# Patient Record
Sex: Female | Born: 1955
Health system: Southern US, Community
[De-identification: ages and names within clinical notes are randomized; demographics above are authoritative.]

## PROBLEM LIST (undated history)

## (undated) DIAGNOSIS — I4891 Unspecified atrial fibrillation: Secondary | ICD-10-CM

## (undated) DIAGNOSIS — E079 Disorder of thyroid, unspecified: Secondary | ICD-10-CM

## (undated) DIAGNOSIS — T7840XA Allergy, unspecified, initial encounter: Secondary | ICD-10-CM

## (undated) DIAGNOSIS — H269 Unspecified cataract: Secondary | ICD-10-CM

## (undated) DIAGNOSIS — E0590 Thyrotoxicosis, unspecified without thyrotoxic crisis or storm: Secondary | ICD-10-CM

## (undated) DIAGNOSIS — T41205A Adverse effect of unspecified general anesthetics, initial encounter: Secondary | ICD-10-CM

## (undated) DIAGNOSIS — F32A Depression, unspecified: Secondary | ICD-10-CM

## (undated) DIAGNOSIS — Z9889 Other specified postprocedural states: Secondary | ICD-10-CM

## (undated) DIAGNOSIS — R112 Nausea with vomiting, unspecified: Secondary | ICD-10-CM

## (undated) DIAGNOSIS — D649 Anemia, unspecified: Secondary | ICD-10-CM

## (undated) DIAGNOSIS — M797 Fibromyalgia: Secondary | ICD-10-CM

## (undated) DIAGNOSIS — G43909 Migraine, unspecified, not intractable, without status migrainosus: Secondary | ICD-10-CM

## (undated) DIAGNOSIS — J45909 Unspecified asthma, uncomplicated: Secondary | ICD-10-CM

## (undated) HISTORY — PX: BREAST SURGERY: SHX581

## (undated) HISTORY — DX: Unspecified cataract: H26.9

## (undated) HISTORY — PX: APPENDECTOMY: SHX54

## (undated) HISTORY — PX: BUNIONECTOMY: SHX129

## (undated) HISTORY — PX: COLONOSCOPY: SHX174

## (undated) HISTORY — DX: Migraine, unspecified, not intractable, without status migrainosus: G43.909

## (undated) HISTORY — PX: TUBAL LIGATION: SHX77

## (undated) HISTORY — PX: ENDOMETRIAL ABLATION: SHX621

## (undated) HISTORY — DX: Disorder of thyroid, unspecified: E07.9

## (undated) HISTORY — DX: Depression, unspecified: F32.A

## (undated) HISTORY — PX: BREAST IMPLANT REMOVAL: SHX5361

## (undated) HISTORY — DX: Allergy, unspecified, initial encounter: T78.40XA

## (undated) HISTORY — PX: KNEE ARTHROSCOPY: SUR90

## (undated) HISTORY — PX: ABDOMINAL HYSTERECTOMY: SHX81

## (undated) HISTORY — PX: CHOLECYSTECTOMY: SHX55

## (undated) HISTORY — PX: EYE SURGERY: SHX253

## (undated) HISTORY — PX: BREAST ENHANCEMENT SURGERY: SHX7

## (undated) HISTORY — DX: Unspecified atrial fibrillation: I48.91

---

## 1997-11-21 ENCOUNTER — Ambulatory Visit (HOSPITAL_COMMUNITY): Admission: RE | Admit: 1997-11-21 | Discharge: 1997-11-21 | Payer: Self-pay | Admitting: Obstetrics and Gynecology

## 1997-12-16 ENCOUNTER — Other Ambulatory Visit: Admission: RE | Admit: 1997-12-16 | Discharge: 1997-12-16 | Payer: Self-pay | Admitting: Obstetrics and Gynecology

## 1999-03-30 ENCOUNTER — Other Ambulatory Visit: Admission: RE | Admit: 1999-03-30 | Discharge: 1999-03-30 | Payer: Self-pay | Admitting: Obstetrics and Gynecology

## 1999-12-19 ENCOUNTER — Encounter: Payer: Self-pay | Admitting: Obstetrics and Gynecology

## 1999-12-19 ENCOUNTER — Encounter: Admission: RE | Admit: 1999-12-19 | Discharge: 1999-12-19 | Payer: Self-pay | Admitting: Obstetrics and Gynecology

## 2000-04-24 ENCOUNTER — Other Ambulatory Visit: Admission: RE | Admit: 2000-04-24 | Discharge: 2000-04-24 | Payer: Self-pay | Admitting: Obstetrics and Gynecology

## 2000-06-19 ENCOUNTER — Other Ambulatory Visit: Admission: RE | Admit: 2000-06-19 | Discharge: 2000-06-19 | Payer: Self-pay | Admitting: Obstetrics and Gynecology

## 2000-06-19 ENCOUNTER — Encounter (INDEPENDENT_AMBULATORY_CARE_PROVIDER_SITE_OTHER): Payer: Self-pay | Admitting: Specialist

## 2000-12-26 ENCOUNTER — Encounter: Payer: Self-pay | Admitting: Obstetrics and Gynecology

## 2000-12-26 ENCOUNTER — Encounter: Admission: RE | Admit: 2000-12-26 | Discharge: 2000-12-26 | Payer: Self-pay | Admitting: Obstetrics and Gynecology

## 2001-06-02 ENCOUNTER — Other Ambulatory Visit: Admission: RE | Admit: 2001-06-02 | Discharge: 2001-06-02 | Payer: Self-pay | Admitting: Obstetrics and Gynecology

## 2002-01-19 ENCOUNTER — Encounter: Payer: Self-pay | Admitting: Obstetrics and Gynecology

## 2002-01-19 ENCOUNTER — Encounter: Admission: RE | Admit: 2002-01-19 | Discharge: 2002-01-19 | Payer: Self-pay | Admitting: Obstetrics and Gynecology

## 2002-07-26 ENCOUNTER — Other Ambulatory Visit: Admission: RE | Admit: 2002-07-26 | Discharge: 2002-07-26 | Payer: Self-pay | Admitting: Obstetrics and Gynecology

## 2003-02-28 ENCOUNTER — Encounter: Admission: RE | Admit: 2003-02-28 | Discharge: 2003-02-28 | Payer: Self-pay | Admitting: Obstetrics and Gynecology

## 2003-02-28 ENCOUNTER — Encounter: Payer: Self-pay | Admitting: Obstetrics and Gynecology

## 2003-08-16 ENCOUNTER — Other Ambulatory Visit: Admission: RE | Admit: 2003-08-16 | Discharge: 2003-08-16 | Payer: Self-pay | Admitting: Obstetrics and Gynecology

## 2004-04-24 ENCOUNTER — Encounter: Admission: RE | Admit: 2004-04-24 | Discharge: 2004-04-24 | Payer: Self-pay | Admitting: Obstetrics and Gynecology

## 2005-05-15 ENCOUNTER — Encounter: Admission: RE | Admit: 2005-05-15 | Discharge: 2005-05-15 | Payer: Self-pay | Admitting: Obstetrics and Gynecology

## 2006-05-19 ENCOUNTER — Encounter: Admission: RE | Admit: 2006-05-19 | Discharge: 2006-05-19 | Payer: Self-pay | Admitting: Obstetrics and Gynecology

## 2007-05-25 ENCOUNTER — Encounter: Admission: RE | Admit: 2007-05-25 | Discharge: 2007-05-25 | Payer: Self-pay | Admitting: Obstetrics and Gynecology

## 2008-06-15 ENCOUNTER — Encounter: Admission: RE | Admit: 2008-06-15 | Discharge: 2008-06-15 | Payer: Self-pay | Admitting: Obstetrics and Gynecology

## 2009-06-21 ENCOUNTER — Encounter: Admission: RE | Admit: 2009-06-21 | Discharge: 2009-06-21 | Payer: Self-pay | Admitting: Obstetrics and Gynecology

## 2010-06-25 ENCOUNTER — Encounter: Admission: RE | Admit: 2010-06-25 | Discharge: 2010-06-25 | Payer: Self-pay | Admitting: Obstetrics and Gynecology

## 2011-06-13 ENCOUNTER — Other Ambulatory Visit: Payer: Self-pay | Admitting: Obstetrics and Gynecology

## 2011-06-13 DIAGNOSIS — Z1231 Encounter for screening mammogram for malignant neoplasm of breast: Secondary | ICD-10-CM

## 2011-07-25 ENCOUNTER — Ambulatory Visit
Admission: RE | Admit: 2011-07-25 | Discharge: 2011-07-25 | Disposition: A | Discharge status: Home / Self Care | Payer: 59 | Source: Ambulatory Visit | Attending: Obstetrics and Gynecology | Admitting: Obstetrics and Gynecology

## 2011-07-25 DIAGNOSIS — Z1231 Encounter for screening mammogram for malignant neoplasm of breast: Secondary | ICD-10-CM

## 2012-06-11 ENCOUNTER — Encounter: Payer: Self-pay | Admitting: Internal Medicine

## 2012-06-11 ENCOUNTER — Emergency Department (HOSPITAL_BASED_OUTPATIENT_CLINIC_OR_DEPARTMENT_OTHER): Payer: 59

## 2012-06-11 ENCOUNTER — Emergency Department (HOSPITAL_BASED_OUTPATIENT_CLINIC_OR_DEPARTMENT_OTHER)
Admission: EM | Admit: 2012-06-11 | Discharge: 2012-06-11 | Disposition: A | Discharge status: Home / Self Care | Payer: 59 | Attending: Emergency Medicine | Admitting: Emergency Medicine

## 2012-06-11 ENCOUNTER — Ambulatory Visit: Payer: 59 | Admitting: Internal Medicine

## 2012-06-11 ENCOUNTER — Ambulatory Visit (INDEPENDENT_AMBULATORY_CARE_PROVIDER_SITE_OTHER): Payer: 59 | Admitting: Internal Medicine

## 2012-06-11 ENCOUNTER — Encounter (HOSPITAL_BASED_OUTPATIENT_CLINIC_OR_DEPARTMENT_OTHER): Payer: Self-pay | Admitting: Emergency Medicine

## 2012-06-11 VITALS — BP 110/80 | HR 105 | Temp 98.0°F | Resp 18 | Ht 67.0 in | Wt 137.0 lb

## 2012-06-11 DIAGNOSIS — I480 Paroxysmal atrial fibrillation: Secondary | ICD-10-CM

## 2012-06-11 DIAGNOSIS — F329 Major depressive disorder, single episode, unspecified: Secondary | ICD-10-CM | POA: Insufficient documentation

## 2012-06-11 DIAGNOSIS — Z139 Encounter for screening, unspecified: Secondary | ICD-10-CM

## 2012-06-11 DIAGNOSIS — I4891 Unspecified atrial fibrillation: Secondary | ICD-10-CM | POA: Insufficient documentation

## 2012-06-11 DIAGNOSIS — R002 Palpitations: Secondary | ICD-10-CM

## 2012-06-11 DIAGNOSIS — IMO0001 Reserved for inherently not codable concepts without codable children: Secondary | ICD-10-CM

## 2012-06-11 DIAGNOSIS — R109 Unspecified abdominal pain: Secondary | ICD-10-CM

## 2012-06-11 DIAGNOSIS — Z780 Asymptomatic menopausal state: Secondary | ICD-10-CM | POA: Insufficient documentation

## 2012-06-11 DIAGNOSIS — Z9049 Acquired absence of other specified parts of digestive tract: Secondary | ICD-10-CM | POA: Insufficient documentation

## 2012-06-11 DIAGNOSIS — M797 Fibromyalgia: Secondary | ICD-10-CM

## 2012-06-11 DIAGNOSIS — Z8669 Personal history of other diseases of the nervous system and sense organs: Secondary | ICD-10-CM

## 2012-06-11 DIAGNOSIS — Z9889 Other specified postprocedural states: Secondary | ICD-10-CM | POA: Insufficient documentation

## 2012-06-11 DIAGNOSIS — F3289 Other specified depressive episodes: Secondary | ICD-10-CM

## 2012-06-11 DIAGNOSIS — F32A Depression, unspecified: Secondary | ICD-10-CM | POA: Insufficient documentation

## 2012-06-11 LAB — CBC WITH DIFFERENTIAL/PLATELET
Basophils Absolute: 0 10*3/uL (ref 0.0–0.1)
HCT: 42.6 % (ref 36.0–46.0)
Monocytes Absolute: 0.9 10*3/uL (ref 0.1–1.0)
Monocytes Relative: 13 % — ABNORMAL HIGH (ref 3–12)
Neutrophils Relative %: 55 % (ref 43–77)
Platelets: 252 10*3/uL (ref 150–400)
RDW: 12.2 % (ref 11.5–15.5)

## 2012-06-11 LAB — BASIC METABOLIC PANEL
Calcium: 9.7 mg/dL (ref 8.4–10.5)
Creatinine, Ser: 0.7 mg/dL (ref 0.50–1.10)
GFR calc non Af Amer: >90 mL/min (ref >90)
Sodium: 142 mEq/L (ref 135–145)

## 2012-06-11 LAB — TSH: TSH: 0.008 u[IU]/mL — ABNORMAL LOW (ref 0.350–4.500)

## 2012-06-11 LAB — TROPONIN I: Troponin I: <0.3 ng/mL (ref <0.30)

## 2012-06-11 MED ORDER — DILTIAZEM HCL 100 MG IV SOLR: 5.0000 mg/h | Freq: Once | INTRAVENOUS | Status: DC

## 2012-06-11 MED ORDER — DILTIAZEM HCL 100 MG IV SOLR
INTRAVENOUS | Status: AC
  Filled 2012-06-11: qty 100

## 2012-06-11 MED ORDER — SODIUM CHLORIDE 0.9 % IV BOLUS (SEPSIS)
1000.0000 mL | Freq: Once | INTRAVENOUS | Status: AC
  Administered 2012-06-11: 1000 mL via INTRAVENOUS

## 2012-06-11 MED ORDER — METOPROLOL TARTRATE 25 MG PO TABS: ORAL_TABLET | ORAL | Status: DC

## 2012-06-11 MED ORDER — DILTIAZEM HCL 25 MG/5ML IV SOLN
INTRAVENOUS | Status: AC
  Filled 2012-06-11: qty 5

## 2012-06-11 MED ORDER — ASPIRIN EC 325 MG PO TBEC
325.0000 mg | DELAYED_RELEASE_TABLET | Freq: Once | ORAL | Status: AC
  Administered 2012-06-11: 325 mg via ORAL
  Filled 2012-06-11: qty 1

## 2012-06-11 MED ORDER — METOPROLOL TARTRATE 50 MG PO TABS
25.0000 mg | ORAL_TABLET | Freq: Once | ORAL | Status: AC
  Administered 2012-06-11: 25 mg via ORAL
  Filled 2012-06-11: qty 1

## 2012-06-11 MED ORDER — DILTIAZEM HCL 50 MG/10ML IV SOLN
15.0000 mg | Freq: Once | INTRAVENOUS | Status: DC
  Filled 2012-06-11: qty 3

## 2012-06-11 NOTE — Progress Notes (Signed)
Subjective:    Patient ID: Christy Hill, female    DOB: Sep 24, 1955, 56 y.o.   MRN: [id number]  HPI New pt here for first visit.  Former care Dr. Leonette Most who she just saw in May of this year.  PMH depression formerly on Wellbutrin (now off for 5 years), menopause, and migraine headache and question of fibormyalgia vs sjogrens vs sicca syndrome  She has several concerns.   She has been feeling palpitaitons for the past two weeks.  No chest pain or pressure,  Sometimes pain goes through to her back.  No dizziness or syncope feelings.  She drinks 3-4 cups coffee daily.  FH positive for Afib in father and several family members on her father's side.   She is also concerned about immediate memory problems and wonders if she is Vitamin B12 deficient.    She has been on HT for about 3 years  Initially started due to menopausal  flushes.    She has seen a rheumatologist and it is not clear if pt has fibromyalgia, SICCA syndrome for sjograns.  I do not have any old records.  She also gives history of long standing "stomach issues" even undergoing exploratory lap when she was 56 yo.  "They found nothing". She is S/P cholecystectomy.  She reports she is on a gluten free diet  No Known Allergies Past Medical History  Diagnosis Date  . Migraines    Past Surgical History  Procedure Date  . Cesarean section   . Appendectomy   . Cholecystectomy   . Endometrial ablation   . Knee arthroscopy   . Breast implant removal   . Breast enhancement surgery    History   Social History  . Marital Status: Married    Spouse Name: N/A    Number of Children: N/A  . Years of Education: N/A   Occupational History  . Not on file.   Social History Main Topics  . Smoking status: Never Smoker   . Smokeless tobacco: Not on file  . Alcohol Use: Yes  . Drug Use: No  . Sexually Active: Yes   Other Topics Concern  . Not on file   Social History Narrative  . No narrative on file   Family History  Problem  Relation Age of Onset  . Depression Mother   . Cancer Father   . Dementia Father   . Depression Sister   . Depression Brother   . Pernicious anemia Paternal Aunt   . Pernicious anemia Paternal Grandmother   . Alcohol abuse Neg Hx   . Bipolar disorder Son    Patient Active Problem List  Diagnosis  . Menopause  . History of migraine headaches  . Depression  . S/P cholecystectomy  . S/P exploratory laparotomy   Current Outpatient Prescriptions on File Prior to Visit  Medication Sig Dispense Refill  . estradiol (VIVELLE-DOT) 0.05 MG/24HR Place 1 patch onto the skin once a week. Change patch twice a week      . progesterone (PROMETRIUM) 100 MG capsule Take 100 mg by mouth daily.           Review of Systems See HPI    Objective:   Physical Exam Physical Exam  Nursing note and vitals reviewed.  Alert in no acute distress Constitutional: She is oriented to person, place, and time. She appears well-developed and well-nourished.  HENT:  Head: Normocephalic and atraumatic.  Cardiovascular: S1S2 irreg irreg.  Rapid rate No murmur heard.  Pulmonary/Chest: Breath sounds  normal. She has no wheezes. She has no rales.  Neurological: She is alert and oriented to person, place, and time.  Skin: Skin is warm and dry.  Psychiatric: She has a normal mood and affect. Her behavior is normal.   EKG  Reveals rapid afib rate 147         Assessment & Plan:  Rapid new onset atrial fib:  Pt transported to ER for further treatment.   History of migraines  History of derpession  ??? Auto immune disorder    Will treat immediate cardiology condition and further work up when pt more stable  Transporte to ED via wheelchair

## 2012-06-11 NOTE — ED Notes (Signed)
This rn enters room to administer cardizem, pt noted to be in nsr. Repeat ekg obtained, shown to dr. Ignacia Palma. Pt cont deny any c/o, talking with husband at bedside in nad.

## 2012-06-11 NOTE — ED Notes (Signed)
Dr. Ignacia Palma now at bedside for exam. Pt denies any c/o.

## 2012-06-11 NOTE — Patient Instructions (Addendum)
To er

## 2012-06-11 NOTE — ED Notes (Signed)
Pt to room 5 in w/c, sent to ed from dr. Bettey Mare office for eval of new onset afib. Pt reports "a few weeks of my heart beating funny off and on..." pt connected to cardiac monitor, iv access obtained, pt is awake and alert denies any sob, nausea, chest pain, or any other c/o "I feel perfectly fine!"

## 2012-06-11 NOTE — ED Notes (Signed)
Was seen upstairs in Dr Bishop Dublin office with c.o being tired and having irregular heartbeat for a coupe of weeks. EKG was done and showed atrial fib.

## 2012-06-11 NOTE — ED Provider Notes (Addendum)
History     CSN: [id number]  Arrival date & time 06/11/12  1550   First MD Initiated Contact with Patient 06/11/12 1626      Chief Complaint  Patient presents with  . Irregular Heart Beat    (Consider location/radiation/quality/duration/timing/severity/associated sxs/prior treatment) HPI Comments: Patient is a 56 year old woman who was sent from Dr. Lonell Face office because she was found to have rapid atrial fibrillation on EKG. She says that she's noted intermittent rapid heartbeat over the past 2 weeks on and off. There is no chest pain.  She has no prior history of atrial fibrillation. Has also no prior history of hypothyroidism.  Patient is a 56 y.o. female presenting with palpitations. The history is provided by the patient. No language interpreter was used.  Palpitations  This is a new problem. The current episode started more than 1 week ago. Episode frequency: Intermittent episodes of rapid heartbeat over the past 2 weeks. The problem has not changed since onset.Associated with: Nothing. Associated symptoms include irregular heartbeat. Pertinent negatives include no fever. She has tried nothing for the symptoms. There are no known risk factors. Her past medical history does not include anemia, heart disease, hyperthyroidism or valve disorder.    Past Medical History  Diagnosis Date  . Migraines     Past Surgical History  Procedure Date  . Cesarean section   . Appendectomy   . Cholecystectomy   . Endometrial ablation   . Knee arthroscopy   . Breast implant removal   . Breast enhancement surgery     Family History  Problem Relation Age of Onset  . Depression Mother   . Cancer Father   . Dementia Father   . Depression Sister   . Depression Brother   . Pernicious anemia Paternal Aunt   . Pernicious anemia Paternal Grandmother   . Alcohol abuse Neg Hx   . Bipolar disorder Son     History  Substance Use Topics  . Smoking status: Never Smoker   . Smokeless  tobacco: Not on file  . Alcohol Use: Yes    OB History    Grav Para Term Preterm Abortions TAB SAB Ect Mult Living   4    2  2   3       Review of Systems  Constitutional: Negative for fever and chills.  HENT: Negative.   Eyes: Negative.   Cardiovascular: Positive for palpitations.  Gastrointestinal: Negative.   Genitourinary: Negative.   Musculoskeletal: Negative.   Skin: Negative.   Neurological: Negative.   Hematological: Does not bruise/bleed easily.  Psychiatric/Behavioral: Negative.     Allergies  Review of patient's allergies indicates no known allergies.  Home Medications   Current Outpatient Rx  Name  Route  Sig  Dispense  Refill  . BUTALBITAL-APAP-CAFFEINE 50-325-40 MG PO TABS   Oral   Take 1 tablet by mouth every 6 (six) hours as needed.         Marland Kitchen ESTROGENS, CONJUGATED 0.625 MG/GM VA CREA   Vaginal   Place 0.5 g vaginally once a week.         Marland Kitchen ESTRADIOL 0.05 MG/24HR TD PTTW   Transdermal   Place 1 patch onto the skin once a week. Change patch twice a week         . PROGESTERONE MICRONIZED 100 MG PO CAPS   Oral   Take 100 mg by mouth daily.           BP 127/84  Pulse 151  Temp  98.2 F (36.8 C) (Oral)  Resp 18  Ht 5\' 7"  (1.702 m)  Wt 137 lb (62.143 kg)  BMI 21.46 kg/m2  SpO2 98%  LMP 08/12/2003  Physical Exam  Nursing note and vitals reviewed. Constitutional: She is oriented to person, place, and time. She appears well-developed and well-nourished. No distress.  HENT:  Head: Normocephalic and atraumatic.  Right Ear: External ear normal.  Left Ear: External ear normal.  Mouth/Throat: Oropharynx is clear and moist.  Eyes: Conjunctivae normal and EOM are normal. Pupils are equal, round, and reactive to light.  Neck: Normal range of motion. Neck supple. No thyromegaly present.  Cardiovascular:       Rapid and irregular rhythm.  No murmur.  Pulmonary/Chest: Effort normal and breath sounds normal.  Abdominal: Soft. Bowel sounds are  normal.  Musculoskeletal: Normal range of motion. She exhibits no edema and no tenderness.  Neurological: She is alert and oriented to person, place, and time.       No sensory or motor deficit.  Skin: Skin is warm and dry.  Psychiatric: She has a normal mood and affect. Her behavior is normal.    ED Course  Procedures (including critical care time)  Labs Reviewed  CBC WITH DIFFERENTIAL - Abnormal; Notable for the following:    Monocytes Relative 13 (*)     All other components within normal limits  BASIC METABOLIC PANEL  TROPONIN I   5:06 PM  Date: 06/11/2012  Rate:148  Rhythm: atrial fibrillation  QRS Axis: normal  Intervals: normal QRS:  Poor R wave progression in precordial leads suggests old anterior myocardial infarction.  ST/T Wave abnormalities: nonspecific ST changes  Conduction Disutrbances:none  Narrative Interpretation: Abnormal EKG  Old EKG Reviewed: none available   Pt seen --> physical exam performed.  EKG shows rapid atrial fibrillation. Lab workup ordered.  IV Cardizem ordered.  5:44 PM Before Cardizem could be given, pt went back into sinus rhythm spontaneously.   Date: 06/11/2012  Rate: 92  Rhythm: normal sinus rhythm  QRS Axis: normal  Intervals: normal QRS:  Poor R wave progression in precordial leads suggests old anterior myocardial infarction.  ST/T Wave abnormalities: normal  Conduction Disutrbances:none  Narrative Interpretation: Abnormal EKG  Old EKG Reviewed: changes noted--was in rapid atrial fibrillation with rate 148 earlier this afternoon.   6:06 PM Spoke with Dr. Royann Shivers --Rx with metoprolol 25 mg bid, ASA 325 mg qd.  [facility] will call her tomorrow with a time to be seen tomorrow.   1. Paroxysmal atrial fibrillation          Carleene Cooper III, MD 06/11/12 1811    Carleene Cooper III, MD 06/11/12 [phone]

## 2012-06-11 NOTE — ED Notes (Signed)
Pt has been moved to room 13. Unable to move her name in the computer due to chart being opened elsewhere in the department by registration.

## 2012-06-15 ENCOUNTER — Telehealth: Payer: Self-pay | Admitting: *Deleted

## 2012-06-15 NOTE — Telephone Encounter (Signed)
Called pt regarding appt with Dr Allyson Sabal left message

## 2012-06-16 ENCOUNTER — Ambulatory Visit: Payer: 59 | Admitting: *Deleted

## 2012-06-16 LAB — CBC WITH DIFFERENTIAL/PLATELET
Basophils Absolute: 0 10*3/uL (ref 0.0–0.1)
Basophils Relative: 0 % (ref 0–1)
Eosinophils Absolute: 0.1 10*3/uL (ref 0.0–0.7)
HCT: 42.2 % (ref 36.0–46.0)
Hemoglobin: 14.3 g/dL (ref 12.0–15.0)
Lymphocytes Relative: 27 % (ref 12–46)
MCHC: 33.9 g/dL (ref 30.0–36.0)
Neutrophils Relative %: 62 % (ref 43–77)
RBC: 4.69 MIL/uL (ref 3.87–5.11)
RDW: 12.5 % (ref 11.5–15.5)

## 2012-06-16 LAB — COMPREHENSIVE METABOLIC PANEL
AST: 19 U/L (ref 0–37)
Alkaline Phosphatase: 43 U/L (ref 39–117)
BUN: 14 mg/dL (ref 6–23)
Calcium: 9.9 mg/dL (ref 8.4–10.5)
Chloride: 105 mEq/L (ref 96–112)
Creat: 0.66 mg/dL (ref 0.50–1.10)
Glucose, Bld: 89 mg/dL (ref 70–99)
Total Bilirubin: 0.5 mg/dL (ref 0.3–1.2)

## 2012-06-16 LAB — TSH: TSH: <0.008 u[IU]/mL — ABNORMAL LOW (ref 0.350–4.500)

## 2012-06-16 NOTE — Progress Notes (Unsigned)
Pt concerned about recent dx of a fib. Answered questions and reassured pt that she is safe to perform ADLS sent pt upstairs to have labs drawn, and called Vonita Moss at Dr Hazle Coca office to arrange an Echo. Will follow up with pt

## 2012-06-17 ENCOUNTER — Encounter: Payer: Self-pay | Admitting: Internal Medicine

## 2012-06-17 ENCOUNTER — Telehealth: Payer: Self-pay | Admitting: Internal Medicine

## 2012-06-17 DIAGNOSIS — E0590 Thyrotoxicosis, unspecified without thyrotoxic crisis or storm: Secondary | ICD-10-CM

## 2012-06-17 LAB — ANA: Anti Nuclear Antibody(ANA): NEGATIVE

## 2012-06-17 MED ORDER — METHIMAZOLE 10 MG PO TABS: ORAL_TABLET | ORAL | Status: DC

## 2012-06-17 NOTE — Telephone Encounter (Signed)
Spoke with pt and informed of thyroid results and that she has hyperthyroidism.  She reports that the 25 mg of metoprolol makes her very fatigued. " I can hardly get off the couch"  Had one episode of rapid heart beat yesterday but resolved on its own per her report.  Advised to cut dose of Metoprolol in half and take 12.5 mg.  Advised to start anti-thyroid med methimazole 10 mg daily.  Will also schedule nuclear medicine scan of her thyroid.  Counseled of side effects of headache, rash and lowered  Blood counts and elevated liver tests.    She is to see me early next week. Will need endocrinology referral but will discuss next week  She is scheduled to go out of town next week and is wondering about her heart.  I advised her to call [facility] and discuss with cardiologist.

## 2012-06-18 ENCOUNTER — Telehealth: Payer: Self-pay | Admitting: *Deleted

## 2012-06-18 DIAGNOSIS — E0590 Thyrotoxicosis, unspecified without thyrotoxic crisis or storm: Secondary | ICD-10-CM

## 2012-06-18 NOTE — Telephone Encounter (Signed)
Spoke with Dr. Talmage Nap. She will see Pt Friday at 12 noon

## 2012-06-18 NOTE — Telephone Encounter (Signed)
Ov notes faxed to Dr Willeen Cass office

## 2012-06-18 NOTE — Telephone Encounter (Signed)
Called pt with details of appt with Dr Talmage Nap

## 2012-06-20 LAB — THYROID STIMULATING IMMUNOGLOBULIN: TSI: 498 % baseline — ABNORMAL HIGH (ref <140)

## 2012-06-21 ENCOUNTER — Encounter: Payer: Self-pay | Admitting: Internal Medicine

## 2012-06-21 DIAGNOSIS — J309 Allergic rhinitis, unspecified: Secondary | ICD-10-CM | POA: Insufficient documentation

## 2012-06-21 DIAGNOSIS — Z8739 Personal history of other diseases of the musculoskeletal system and connective tissue: Secondary | ICD-10-CM | POA: Insufficient documentation

## 2012-06-22 ENCOUNTER — Ambulatory Visit (INDEPENDENT_AMBULATORY_CARE_PROVIDER_SITE_OTHER): Payer: 59 | Admitting: Internal Medicine

## 2012-06-22 ENCOUNTER — Encounter: Payer: Self-pay | Admitting: Internal Medicine

## 2012-06-22 ENCOUNTER — Other Ambulatory Visit (HOSPITAL_COMMUNITY): Payer: Self-pay | Admitting: Endocrinology

## 2012-06-22 VITALS — BP 100/60 | HR 74 | Temp 99.0°F | Resp 18 | Wt 137.0 lb

## 2012-06-22 DIAGNOSIS — E0590 Thyrotoxicosis, unspecified without thyrotoxic crisis or storm: Secondary | ICD-10-CM

## 2012-06-22 DIAGNOSIS — I4891 Unspecified atrial fibrillation: Secondary | ICD-10-CM

## 2012-06-22 DIAGNOSIS — R002 Palpitations: Secondary | ICD-10-CM

## 2012-06-22 NOTE — Progress Notes (Signed)
Subjective:    Patient ID: Christy Hill, female    DOB: 20-Nov-1955, 56 y.o.   MRN: [id number]  HPI  Kamry is here for follow up.  She has new onset a fib and was found to be hyperthyroid.  She has seen Dr. Talmage Nap who changed her medication to atenolol 25 mg 1/2 tablet daily.  She is taking her methimazole daily She has an upcoming nuclear scan on 12/4  She has only one further episode of palpitations on the 11/13  No dizziness or chest pain.  No Known Allergies Past Medical History  Diagnosis Date  . Migraines   . Atrial fibrillation   . Thyroid disease    Past Surgical History  Procedure Date  . Cesarean section   . Appendectomy   . Cholecystectomy   . Endometrial ablation   . Knee arthroscopy   . Breast implant removal   . Breast enhancement surgery    History   Social History  . Marital Status: Married    Spouse Name: N/A    Number of Children: N/A  . Years of Education: N/A   Occupational History  . Not on file.   Social History Main Topics  . Smoking status: Never Smoker   . Smokeless tobacco: Not on file  . Alcohol Use: Yes  . Drug Use: No  . Sexually Active: Yes   Other Topics Concern  . Not on file   Social History Narrative  . No narrative on file   Family History  Problem Relation Age of Onset  . Depression Mother   . Cancer Father   . Dementia Father   . Depression Sister   . Depression Brother   . Pernicious anemia Paternal Aunt   . Pernicious anemia Paternal Grandmother   . Alcohol abuse Neg Hx   . Bipolar disorder Son    Patient Active Problem List  Diagnosis  . Menopause  . History of migraine headaches  . Depression  . S/P cholecystectomy  . S/P exploratory laparotomy  . New onset atrial fibrillation  . History of fibromyalgia  . Allergic rhinitis   Current Outpatient Prescriptions on File Prior to Visit  Medication Sig Dispense Refill  . atenolol (TENORMIN) 25 MG tablet Take 50 mg by mouth 2 (two) times daily.      .  butalbital-acetaminophen-caffeine (FIORICET, ESGIC) 50-325-40 MG per tablet Take 1 tablet by mouth every 6 (six) hours as needed.      . conjugated estrogens (PREMARIN) vaginal cream Place 0.5 g vaginally once a week.      . estradiol (VIVELLE-DOT) 0.05 MG/24HR Place 1 patch onto the skin once a week. Change patch twice a week      . methimazole (TAPAZOLE) 10 MG tablet Take one tablet daily  30 tablet  1  . progesterone (PROMETRIUM) 100 MG capsule Take 100 mg by mouth daily.          Review of Systems See HPI    Objective:   Physical Exam  Physical Exam  Nursing note and vitals reviewed.  Constitutional: She is oriented to person, place, and time. She appears well-developed and well-nourished.  HENT:  Head: Normocephalic and atraumatic.  Cardiovascular: Normal rate and regular rhythm. Exam reveals no gallop and no friction rub.  No murmur heard.  Pulmonary/Chest: Breath sounds normal. She has no wheezes. She has no rales.  Neurological: She is alert and oriented to person, place, and time.  Skin: Skin is warm and dry.  Psychiatric: She  has a normal mood and affect. Her behavior is normal.  Ext trace edema           Assessment & Plan:  Hyperthyroidsm:  Continue methimazole daily  Aware of need to check CBC and liver tests.  She will have these done at Dr. Willeen Cass office  Nuclear scan pending  Atrial fib  Managed by Dr. Tresa Endo.  She has appt in am  Palpitations

## 2012-06-22 NOTE — Patient Instructions (Addendum)
See me as needed 

## 2012-07-02 ENCOUNTER — Encounter: Payer: Self-pay | Admitting: Internal Medicine

## 2012-07-06 ENCOUNTER — Telehealth: Payer: Self-pay | Admitting: Internal Medicine

## 2012-07-06 NOTE — Telephone Encounter (Signed)
Call pt and let her know that mouth sores are not usually a problem with WBC but if she is concerned she can see me in office or Dr. Talmage Nap.

## 2012-07-08 ENCOUNTER — Encounter: Payer: Self-pay | Admitting: *Deleted

## 2012-07-09 NOTE — Telephone Encounter (Signed)
Returned pt call states that she has been using magic mouthwash for pain

## 2012-07-13 ENCOUNTER — Encounter (HOSPITAL_COMMUNITY)
Admission: RE | Admit: 2012-07-13 | Discharge: 2012-07-13 | Disposition: A | Discharge status: Home / Self Care | Payer: 59 | Source: Ambulatory Visit | Attending: Endocrinology | Admitting: Endocrinology

## 2012-07-13 DIAGNOSIS — E0590 Thyrotoxicosis, unspecified without thyrotoxic crisis or storm: Secondary | ICD-10-CM | POA: Insufficient documentation

## 2012-07-14 ENCOUNTER — Encounter (HOSPITAL_COMMUNITY)
Admission: RE | Admit: 2012-07-14 | Discharge: 2012-07-14 | Disposition: A | Discharge status: Home / Self Care | Payer: 59 | Source: Ambulatory Visit | Attending: Endocrinology | Admitting: Endocrinology

## 2012-07-14 MED ORDER — SODIUM PERTECHNETATE TC 99M INJECTION
11.0000 | Freq: Once | INTRAVENOUS | Status: AC | PRN
  Administered 2012-07-14: 11 via INTRAVENOUS

## 2012-07-14 MED ORDER — SODIUM IODIDE I 131 CAPSULE
10.1000 | Freq: Once | INTRAVENOUS | Status: AC | PRN
  Administered 2012-07-13: 10.1 via ORAL

## 2012-07-16 ENCOUNTER — Ambulatory Visit
Admission: RE | Admit: 2012-07-16 | Discharge: 2012-07-16 | Disposition: A | Discharge status: Home / Self Care | Payer: 59 | Source: Ambulatory Visit | Attending: Endocrinology | Admitting: Endocrinology

## 2012-07-16 ENCOUNTER — Other Ambulatory Visit: Payer: Self-pay | Admitting: Endocrinology

## 2012-07-16 DIAGNOSIS — E0590 Thyrotoxicosis, unspecified without thyrotoxic crisis or storm: Secondary | ICD-10-CM

## 2012-07-20 ENCOUNTER — Encounter: Payer: Self-pay | Admitting: Internal Medicine

## 2012-07-20 ENCOUNTER — Other Ambulatory Visit: Payer: Self-pay | Admitting: Endocrinology

## 2012-07-20 DIAGNOSIS — E041 Nontoxic single thyroid nodule: Secondary | ICD-10-CM

## 2012-07-21 ENCOUNTER — Encounter: Payer: Self-pay | Admitting: Internal Medicine

## 2012-07-21 ENCOUNTER — Other Ambulatory Visit: Payer: Self-pay | Admitting: Internal Medicine

## 2012-07-21 NOTE — Telephone Encounter (Signed)
Called into [facility] 

## 2012-07-22 ENCOUNTER — Telehealth: Payer: Self-pay | Admitting: *Deleted

## 2012-07-22 MED ORDER — ZOLPIDEM TARTRATE 5 MG PO TABS
5.0000 mg | ORAL_TABLET | Freq: Every evening | ORAL | Status: DC | PRN
Start: 2012-07-21 — End: 2013-02-03

## 2012-07-22 NOTE — Telephone Encounter (Signed)
Called pt regarding my chart message

## 2012-07-23 ENCOUNTER — Other Ambulatory Visit (HOSPITAL_COMMUNITY)
Admission: RE | Admit: 2012-07-23 | Discharge: 2012-07-23 | Disposition: A | Discharge status: Home / Self Care | Payer: 59 | Source: Ambulatory Visit | Attending: Interventional Radiology | Admitting: Interventional Radiology

## 2012-07-23 ENCOUNTER — Ambulatory Visit
Admission: RE | Admit: 2012-07-23 | Discharge: 2012-07-23 | Disposition: A | Discharge status: Home / Self Care | Payer: 59 | Source: Ambulatory Visit | Attending: Endocrinology | Admitting: Endocrinology

## 2012-07-23 DIAGNOSIS — E041 Nontoxic single thyroid nodule: Secondary | ICD-10-CM

## 2012-07-23 DIAGNOSIS — E049 Nontoxic goiter, unspecified: Secondary | ICD-10-CM | POA: Insufficient documentation

## 2012-08-06 ENCOUNTER — Telehealth: Payer: Self-pay | Admitting: Internal Medicine

## 2012-08-06 NOTE — Telephone Encounter (Signed)
PT is now taking 2 tablets daily and needs a refill requesting a refill for 180 tablets (3 months supply)

## 2012-08-06 NOTE — Telephone Encounter (Signed)
Pt has concerns about increasing her MG  METHIMAZOLE 10 MG PO TABS... Please call pt [phone]

## 2012-08-06 NOTE — Telephone Encounter (Signed)
Returned pts call regarding her medication concerns lost phone call awaiting return call

## 2012-08-07 ENCOUNTER — Telehealth: Payer: Self-pay | Admitting: *Deleted

## 2012-08-07 NOTE — Telephone Encounter (Signed)
Bobbie  Check with Dr. Willeen Cass office to confirm correct medication dosing and OK to re-order a 3 month supply when you have current dose

## 2012-08-07 NOTE — Telephone Encounter (Signed)
Called Dr Vidal Schwalbe office to verify Methamimazole RX for refill awaiting return call

## 2012-08-10 ENCOUNTER — Other Ambulatory Visit: Payer: Self-pay | Admitting: Internal Medicine

## 2012-08-11 ENCOUNTER — Telehealth: Payer: Self-pay | Admitting: Internal Medicine

## 2012-08-11 MED ORDER — METHIMAZOLE 10 MG PO TABS: ORAL_TABLET | ORAL | Status: DC

## 2012-08-11 NOTE — Telephone Encounter (Signed)
PT Needs her methimazole (TAPAZOLE) 10 MG tablet Refill... Pt states she does not have any..per pt please call it in and if there any questions [phone].... Pt called in at 659 pm 08/11/2012

## 2012-08-11 NOTE — Telephone Encounter (Signed)
Confirmed with Dr Vidal Schwalbe office that pt is on 10mg  BID

## 2012-08-12 ENCOUNTER — Telehealth: Payer: Self-pay | Admitting: *Deleted

## 2012-08-15 ENCOUNTER — Encounter: Payer: Self-pay | Admitting: Internal Medicine

## 2012-08-20 NOTE — Telephone Encounter (Signed)
error 

## 2012-08-27 ENCOUNTER — Other Ambulatory Visit: Payer: Self-pay | Admitting: Internal Medicine

## 2012-08-27 NOTE — Telephone Encounter (Signed)
Script was sent to [facility] for Tapazole 10mg . To get her by until the mail order had arrived.  Patient called mail order and they have not gotten the order.  Please send 90 day supply to [facility] for her Tapazole 10mg .

## 2012-08-31 ENCOUNTER — Telehealth: Payer: Self-pay | Admitting: Internal Medicine

## 2012-08-31 DIAGNOSIS — Z1231 Encounter for screening mammogram for malignant neoplasm of breast: Secondary | ICD-10-CM

## 2012-08-31 DIAGNOSIS — Z139 Encounter for screening, unspecified: Secondary | ICD-10-CM

## 2012-08-31 NOTE — Telephone Encounter (Signed)
Pt states she needs methimazole (TAPAZOLE) 10 MG tablet Sent to [facility] [phone]... She states the pharmacy states it quicker to call in the order.. Thanks

## 2012-09-01 NOTE — Telephone Encounter (Signed)
Spoke with pt and advised to call Dr. Gilman Schmidt office to refill her thyroid medication    She is due for mm will order for her

## 2012-09-19 ENCOUNTER — Other Ambulatory Visit: Payer: Self-pay

## 2012-09-21 ENCOUNTER — Telehealth: Payer: Self-pay | Admitting: Internal Medicine

## 2012-09-21 NOTE — Telephone Encounter (Signed)
Gave pt the number for Dr Shirlyn Goltz

## 2012-09-21 NOTE — Telephone Encounter (Signed)
Pt needs to speak with someone about what she should do about her eye ducts... Pt was advise that she may seek an eye doctor but she would like to speak with someone.. Please call her at [phone]

## 2012-09-21 NOTE — Telephone Encounter (Signed)
Pt states that she has what she thinks is a clogged tear duct in the right eye denies pain was wondering if it ws a result of thyroid states that for last few weeks it has been tearing up

## 2012-09-21 NOTE — Telephone Encounter (Signed)
Advise pt that unlikely coming from thyroid.  She needs to see an opthalmologist (not optometrist)    She can see someone of her choice or give her number to Dr. Mateo Flow.  She will not need a referral from me

## 2012-09-28 ENCOUNTER — Other Ambulatory Visit: Payer: Self-pay | Admitting: Endocrinology

## 2012-10-16 ENCOUNTER — Telehealth: Payer: Self-pay | Admitting: *Deleted

## 2012-10-16 NOTE — Telephone Encounter (Signed)
Pt notified of appt on 10/21/12 at 11:40

## 2012-10-16 NOTE — Telephone Encounter (Signed)
Pt called requesting a referral to Dr Vincente Poli for irregular bleeding will notify Dr Constance Goltz

## 2012-11-19 ENCOUNTER — Ambulatory Visit
Admission: RE | Admit: 2012-11-19 | Discharge: 2012-11-19 | Disposition: A | Discharge status: Home / Self Care | Payer: 59 | Source: Ambulatory Visit | Attending: Internal Medicine | Admitting: Internal Medicine

## 2012-11-24 ENCOUNTER — Telehealth: Payer: Self-pay | Admitting: Internal Medicine

## 2012-11-24 NOTE — Telephone Encounter (Signed)
Refill request

## 2012-11-24 NOTE — Telephone Encounter (Signed)
Pt would like refill for Vivelle Dot called into [facility] mail order pharmacy.  [facility] phone number [phone].  Pt call back phone number [phone].

## 2012-11-25 MED ORDER — ESTRADIOL 0.05 MG/24HR TD PTTW: MEDICATED_PATCH | TRANSDERMAL | Status: DC

## 2012-12-10 ENCOUNTER — Other Ambulatory Visit: Payer: Self-pay | Admitting: Internal Medicine

## 2012-12-10 ENCOUNTER — Ambulatory Visit: Payer: Self-pay | Admitting: Certified Nurse Midwife

## 2012-12-10 DIAGNOSIS — N939 Abnormal uterine and vaginal bleeding, unspecified: Secondary | ICD-10-CM

## 2012-12-10 NOTE — Telephone Encounter (Signed)
Christy Hill was supposed to see GYN for vaginal spotting.   Did she go??  I do not have documentation.  I need to see documentation before I can safely order more HT  .  Please call pt and see if she went to GYN and I will need office notes from her

## 2012-12-10 NOTE — Telephone Encounter (Signed)
Refill request

## 2012-12-10 NOTE — Telephone Encounter (Signed)
Pt needs refill for Progesterone 100 mg capsules.  Mail order pharmacy [facility] [phone].  Pt call back phone number [phone].

## 2012-12-11 ENCOUNTER — Telehealth: Payer: Self-pay | Admitting: *Deleted

## 2012-12-11 NOTE — Telephone Encounter (Signed)
[facility] to get [facility] notes

## 2012-12-21 ENCOUNTER — Telehealth: Payer: Self-pay | Admitting: *Deleted

## 2012-12-21 NOTE — Telephone Encounter (Signed)
Patient called wanting a rx refill.  Relayed message from nurse to patient that she will need to call her Gyn to get that Rx refilled.

## 2012-12-30 ENCOUNTER — Encounter: Payer: Self-pay | Admitting: *Deleted

## 2013-01-20 ENCOUNTER — Telehealth: Payer: Self-pay | Admitting: Cardiovascular Disease

## 2013-01-20 NOTE — Telephone Encounter (Signed)
Question about a new medicine that another doctor prescribed-have some concerns!

## 2013-01-20 NOTE — Telephone Encounter (Signed)
Returned call.  Pt stated her eye doctor put her on Voltaren XR (diclofenac sodium).  Stated apparently there is some research that NSAIDs can cause heart problems and she wanted to check w/ the cardiologist.  Chart reviewed.  Pt informed based on review of las OV note, that Dr. Tresa Endo thought her afib was related to her hyperthyroidism.  Pt also informed med is contraindicated in pt's w/ heart failure and it appears from OV note her heart was functioning properly.  Pt advised to contact prescriber as she stated it isn't helping w/ the swelling in her eyes.  Pt verbalized understanding and agreed w/ plan.

## 2013-02-03 ENCOUNTER — Other Ambulatory Visit: Payer: Self-pay | Admitting: *Deleted

## 2013-02-03 NOTE — Telephone Encounter (Signed)
Needs refill on Ambien  

## 2013-02-03 NOTE — Telephone Encounter (Signed)
Will call in pending approval 

## 2013-02-03 NOTE — Telephone Encounter (Deleted)
Needs refill on Ambien  

## 2013-02-04 MED ORDER — ZOLPIDEM TARTRATE 5 MG PO TABS: 5.0000 mg | ORAL_TABLET | Freq: Every evening | ORAL | Status: DC | PRN

## 2013-03-10 ENCOUNTER — Other Ambulatory Visit: Payer: Self-pay | Admitting: Obstetrics & Gynecology

## 2013-03-24 ENCOUNTER — Telehealth: Payer: Self-pay | Admitting: *Deleted

## 2013-03-24 NOTE — Telephone Encounter (Signed)
Patient needs refill on Ambien sent to [facility] on [address].  She never received the last Rx called in.  Looks like it was sent to a mail order pharmacy, but she says she does not use them and has not received medication.

## 2013-03-25 ENCOUNTER — Encounter (HOSPITAL_COMMUNITY): Payer: Self-pay | Admitting: *Deleted

## 2013-03-25 ENCOUNTER — Inpatient Hospital Stay (HOSPITAL_COMMUNITY)
Admission: AD | Admit: 2013-03-25 | Discharge: 2013-03-26 | Disposition: A | Discharge status: Home / Self Care | Payer: 59 | Source: Ambulatory Visit | Attending: Obstetrics and Gynecology | Admitting: Obstetrics and Gynecology

## 2013-03-25 DIAGNOSIS — N950 Postmenopausal bleeding: Secondary | ICD-10-CM | POA: Insufficient documentation

## 2013-03-25 LAB — CBC WITH DIFFERENTIAL/PLATELET
Basophils Relative: 1 % (ref 0–1)
Eosinophils Absolute: 0.1 10*3/uL (ref 0.0–0.7)
Eosinophils Relative: 2 % (ref 0–5)
Hemoglobin: 13 g/dL (ref 12.0–15.0)
Lymphs Abs: 2 10*3/uL (ref 0.7–4.0)
MCH: 30.8 pg (ref 26.0–34.0)
MCHC: 34.2 g/dL (ref 30.0–36.0)
MCV: 90 fL (ref 78.0–100.0)
Monocytes Absolute: 0.4 10*3/uL (ref 0.1–1.0)
Monocytes Relative: 7 % (ref 3–12)
Neutrophils Relative %: 53 % (ref 43–77)

## 2013-03-25 MED ORDER — PROGESTERONE MICRONIZED 200 MG PO CAPS: 200.0000 mg | ORAL_CAPSULE | Freq: Once | ORAL | Status: DC

## 2013-03-25 MED ORDER — TRANEXAMIC ACID 650 MG PO TABS: 1300.0000 mg | ORAL_TABLET | Freq: Once | ORAL | Status: DC

## 2013-03-25 MED ORDER — TRANEXAMIC ACID 650 MG PO TABS: 1300.0000 mg | ORAL_TABLET | Freq: Three times a day (TID) | ORAL | Status: DC

## 2013-03-25 MED ORDER — PROGESTERONE MICRONIZED 200 MG PO CAPS
200.0000 mg | ORAL_CAPSULE | Freq: Once | ORAL | Status: AC
  Administered 2013-03-25: 200 mg via ORAL
  Filled 2013-03-25: qty 1

## 2013-03-25 NOTE — MAU Note (Signed)
Pt reports very heavy bleeding and large clots x 5 hours, has been bleeding off/on for 4 weeks and was seen in office today for pre-op for same but bleeding has not been this heavy until now.

## 2013-03-25 NOTE — MAU Provider Note (Signed)
History     CSN: [id number]  Arrival date and time: 03/25/13 2225   First Provider Initiated Contact with Patient 03/25/13 2323      Chief Complaint  Patient presents with  . Vaginal Bleeding   Vaginal Bleeding    Christy Hill is a 57 y.o. [phone] who presents tonight with vaginal bleeding. She states that her periods had stopped for about 9 years and then last month she started having spotting. Tonight at 1730 she started to bleed very heavily, and states that she has been full saturating a pad since then. She is also passing lemon sized clots. She denies any pain. She had an ultrasound done in the office and found 3 fibroids and a polyp. She is scheduled for a D&C on 04/12/13.    Past Medical History  Diagnosis Date  . Migraines   . Atrial fibrillation   . Thyroid disease     Past Surgical History  Procedure Laterality Date  . Cesarean section    . Appendectomy    . Cholecystectomy    . Endometrial ablation    . Knee arthroscopy    . Breast implant removal    . Breast enhancement surgery      Family History  Problem Relation Age of Onset  . Depression Mother   . Cancer Father   . Dementia Father   . Depression Sister   . Depression Brother   . Pernicious anemia Paternal Aunt   . Pernicious anemia Paternal Grandmother   . Alcohol abuse Neg Hx   . Bipolar disorder Son     History  Substance Use Topics  . Smoking status: Never Smoker   . Smokeless tobacco: Not on file  . Alcohol Use: Yes     Comment: HAD ETOH ON TUESDAY    Allergies: No Known Allergies  Prescriptions prior to admission  Medication Sig Dispense Refill  . diclofenac (VOLTAREN) 50 MG EC tablet Take 50 mg by mouth 2 (two) times daily.      Marland Kitchen estradiol (VIVELLE-DOT) 0.05 MG/24HR Apply skin patch twice a week  24 patch  0  . methimazole (TAPAZOLE) 10 MG tablet TAKE 1 TABLET BY MOUTH EVERY DAY  30 tablet  0  . progesterone (PROMETRIUM) 100 MG capsule Take 100 mg by mouth daily.      Marland Kitchen  atenolol (TENORMIN) 25 MG tablet Take 50 mg by mouth 2 (two) times daily.      . butalbital-acetaminophen-caffeine (FIORICET, ESGIC) 50-325-40 MG per tablet Take 1 tablet by mouth every 6 (six) hours as needed.      . conjugated estrogens (PREMARIN) vaginal cream Place 0.5 g vaginally once a week.      . methimazole (TAPAZOLE) 10 MG tablet Take one tablet twice a day  60 tablet  1  . zolpidem (AMBIEN) 5 MG tablet Take 1 tablet (5 mg total) by mouth at bedtime as needed for sleep.  12 tablet  1    Review of Systems  Genitourinary: Positive for vaginal bleeding.   Physical Exam   Blood pressure 102/82, pulse 66, temperature 97.9 F (36.6 C), resp. rate 18, height 5\' 7"  (1.702 m), weight 63.504 kg (140 lb), SpO2 100.00%.  Physical Exam  Nursing note and vitals reviewed. Constitutional: She is oriented to person, place, and time. She appears well-developed and well-nourished. No distress.  Cardiovascular: Normal rate.   Respiratory: Effort normal.  GI: Soft. There is no tenderness.  Neurological: She is alert and oriented to person, place,  and time.  Skin: Skin is warm and dry.  Psychiatric: She has a normal mood and affect.    MAU Course  Procedures  Results for orders placed during the hospital encounter of 03/25/13 (from the past 24 hour(s))  CBC WITH DIFFERENTIAL     Status: None   Collection Time    03/25/13 10:50 PM      Result Value Range   WBC 5.4  4.0 - 10.5 K/uL   RBC 4.22  3.87 - 5.11 MIL/uL   Hemoglobin 13.0  12.0 - 15.0 g/dL   HCT 16.1  09.6 - 04.5 %   MCV 90.0  78.0 - 100.0 fL   MCH 30.8  26.0 - 34.0 pg   MCHC 34.2  30.0 - 36.0 g/dL   RDW 40.9  81.1 - 91.4 %   Platelets 204  150 - 400 K/uL   Neutrophils Relative % 53  43 - 77 %   Neutro Abs 2.9  1.7 - 7.7 K/uL   Lymphocytes Relative 37  12 - 46 %   Lymphs Abs 2.0  0.7 - 4.0 K/uL   Monocytes Relative 7  3 - 12 %   Monocytes Absolute 0.4  0.1 - 1.0 K/uL   Eosinophils Relative 2  0 - 5 %   Eosinophils Absolute  0.1  0.0 - 0.7 K/uL   Basophils Relative 1  0 - 1 %   Basophils Absolute 0.0  0.0 - 0.1 K/uL   2337: Spoke with Dr. Rana Snare, Will give prometrium 200mg  q day,  and FU with the office tomorrow.  Assessment and Plan   1. Postmenopausal bleeding    RX prometrium 200 BID  FU with the office tomorrow.   Tawnya Crook 03/25/2013, 11:30 PM

## 2013-03-25 NOTE — MAU Note (Signed)
PT SAYS SHE IS BLEEDING HEAVY-  X4 WEEKS- STARTED   7-22 .  SHE WENT TO DR MORRIS TODAY- -  HAS D/C [facility] FOR 9-8.  IS SCH HERE FOR PRE-OP ON 9-4.

## 2013-03-25 NOTE — Telephone Encounter (Signed)
See Heather's note 

## 2013-03-26 ENCOUNTER — Encounter (HOSPITAL_COMMUNITY): Payer: Self-pay | Admitting: *Deleted

## 2013-03-26 ENCOUNTER — Inpatient Hospital Stay (HOSPITAL_COMMUNITY)
Admission: AD | Admit: 2013-03-26 | Discharge: 2013-03-27 | Disposition: A | Discharge status: Home / Self Care | Payer: 59 | Source: Ambulatory Visit | Attending: Obstetrics and Gynecology | Admitting: Obstetrics and Gynecology

## 2013-03-26 DIAGNOSIS — N950 Postmenopausal bleeding: Secondary | ICD-10-CM

## 2013-03-26 LAB — CBC
MCH: 31.7 pg (ref 26.0–34.0)
MCHC: 34.2 g/dL (ref 30.0–36.0)
MCV: 92.8 fL (ref 78.0–100.0)
Platelets: 227 10*3/uL (ref 150–400)
RBC: 3.75 MIL/uL — ABNORMAL LOW (ref 3.87–5.11)
RDW: 13.1 % (ref 11.5–15.5)

## 2013-03-26 MED ORDER — ESTROGENS CONJUGATED 25 MG IJ SOLR
25.0000 mg | Freq: Once | INTRAMUSCULAR | Status: AC
  Administered 2013-03-26: 25 mg via INTRAVENOUS
  Filled 2013-03-26: qty 25

## 2013-03-26 MED ORDER — LACTATED RINGERS IV SOLN
INTRAVENOUS | Status: DC
  Administered 2013-03-26: 23:00:00 via INTRAVENOUS

## 2013-03-26 NOTE — MAU Note (Addendum)
Heavy vag bleeding for 27hrs. Was seen MAU last night for vag bleeding. Started Prometrium and Lysteda but cont to have heavy bleeding and clots. Saw Dr Langston Masker in office today and no change in treatment

## 2013-03-26 NOTE — MAU Provider Note (Signed)
History     CSN: [id number]  Arrival date and time: 03/26/13 1478   First Provider Initiated Contact with Patient 03/26/13 2030      Chief Complaint  Patient presents with  . Vaginal Bleeding   Vaginal Bleeding    Christy Hill is a 57 y.o. [id number] who presents today with vaginal bleeding. She was seen yesterday here and was started on Prometrium 200mg , and given a RX for Lysteda. She was unable to fill the RX for Lysteda until this afternoon, and was able to find a pharmacy that carried it. She has not taken a dose of it at this time. When she saw Dr. Langston Masker in the office today the bleeding had slightly decreased, but then a few hours after seeing her the bleeding increased again. She has been saturating an overnight pad every hour and passing clots. Similar to the bleeding she was having last night. She has also started to feel weak and tired as well as have a headache.   Past Medical History  Diagnosis Date  . Migraines   . Atrial fibrillation   . Thyroid disease     Past Surgical History  Procedure Laterality Date  . Cesarean section    . Appendectomy    . Cholecystectomy    . Endometrial ablation    . Knee arthroscopy    . Breast implant removal    . Breast enhancement surgery      Family History  Problem Relation Age of Onset  . Depression Mother   . Cancer Father   . Dementia Father   . Depression Sister   . Depression Brother   . Pernicious anemia Paternal Aunt   . Pernicious anemia Paternal Grandmother   . Alcohol abuse Neg Hx   . Bipolar disorder Son     History  Substance Use Topics  . Smoking status: Never Smoker   . Smokeless tobacco: Not on file  . Alcohol Use: Yes     Comment: HAD ETOH ON TUESDAY    Allergies: No Known Allergies  Prescriptions prior to admission  Medication Sig Dispense Refill  . diclofenac (VOLTAREN) 50 MG EC tablet Take 50 mg by mouth daily.       Marland Kitchen estradiol (VIVELLE-DOT) 0.05 MG/24HR patch Place 1 patch onto the skin  2 (two) times daily. On Wednesday and Sunday      . ferrous sulfate 325 (65 FE) MG tablet Take 325 mg by mouth daily with breakfast.      . methimazole (TAPAZOLE) 10 MG tablet Take 10 mg by mouth 2 (two) times daily.      . progesterone (PROMETRIUM) 100 MG capsule Take 100 mg by mouth daily.      . progesterone (PROMETRIUM) 200 MG capsule Take 200 mg by mouth daily.      . [DISCONTINUED] estradiol (VIVELLE-DOT) 0.05 MG/24HR Apply skin patch twice a week  24 patch  0  . [DISCONTINUED] progesterone (PROMETRIUM) 200 MG capsule Take 1 capsule (200 mg total) by mouth once.  10 capsule  0  . tranexamic acid (LYSTEDA) 650 MG TABS tablet Take 1,300 mg by mouth 3 (three) times daily. X 5 days.        Review of Systems  Genitourinary: Positive for vaginal bleeding.   Physical Exam   Blood pressure 110/76, pulse 90, temperature 98.2 F (36.8 C), resp. rate 18, height 5\' 7"  (1.702 m), weight 62.778 kg (138 lb 6.4 oz), SpO2 99.00%.  Physical Exam  Nursing note and vitals  reviewed. Constitutional: She is oriented to person, place, and time. She appears well-developed and well-nourished. No distress.  Cardiovascular: Normal rate.   Respiratory: Effort normal.  Neurological: She is alert and oriented to person, place, and time.  Skin: Skin is warm and dry.  Psychiatric: She has a normal mood and affect.    MAU Course  Procedures  Results for orders placed during the hospital encounter of 03/26/13 (from the past 24 hour(s))  CBC     Status: Abnormal   Collection Time    03/26/13  8:52 PM      Result Value Range   WBC 6.0  4.0 - 10.5 K/uL   RBC 3.75 (*) 3.87 - 5.11 MIL/uL   Hemoglobin 11.9 (*) 12.0 - 15.0 g/dL   HCT 16.1 (*) 09.6 - 04.5 %   MCV 92.8  78.0 - 100.0 fL   MCH 31.7  26.0 - 34.0 pg   MCHC 34.2  30.0 - 36.0 g/dL   RDW 40.9  81.1 - 91.4 %   Platelets 227  150 - 400 K/uL   2200: Spoke with Dr. Arelia Sneddon. Will start IV and he will be down to speak with the patient.  2234: Dr. Arelia Sneddon  has seen the patient. The plan is to do 25mg  IV estrogen, then DC home and have her reapply Vivelle patch. Do not eat breakfast and if still bleeding in the morning come back for D&C.   Assessment and Plan   1. PMB (postmenopausal bleeding)    Apply Vivelle patch at home Stop Progesterone and Lysteda  FU with Dr. Langston Masker on Monday If still bleeding tomorrow do not eat breakfast and return to MAU for D&C.  Tawnya Crook 03/26/2013, 8:39 PM

## 2013-03-29 ENCOUNTER — Encounter (HOSPITAL_COMMUNITY): Payer: Self-pay

## 2013-03-29 ENCOUNTER — Encounter (HOSPITAL_COMMUNITY)
Admission: RE | Admit: 2013-03-29 | Discharge: 2013-03-29 | Disposition: A | Discharge status: Home / Self Care | Payer: 59 | Source: Ambulatory Visit | Attending: Obstetrics & Gynecology | Admitting: Obstetrics & Gynecology

## 2013-03-29 HISTORY — DX: Thyrotoxicosis, unspecified without thyrotoxic crisis or storm: E05.90

## 2013-03-29 HISTORY — DX: Unspecified asthma, uncomplicated: J45.909

## 2013-03-29 HISTORY — DX: Other specified postprocedural states: R11.2

## 2013-03-29 HISTORY — DX: Anemia, unspecified: D64.9

## 2013-03-29 HISTORY — DX: Other specified postprocedural states: Z98.890

## 2013-03-29 HISTORY — DX: Fibromyalgia: M79.7

## 2013-03-29 LAB — CBC
HCT: 32.7 % — ABNORMAL LOW (ref 36.0–46.0)
Hemoglobin: 11.1 g/dL — ABNORMAL LOW (ref 12.0–15.0)
MCV: 91.1 fL (ref 78.0–100.0)
Platelets: 246 10*3/uL (ref 150–400)
RBC: 3.59 MIL/uL — ABNORMAL LOW (ref 3.87–5.11)
WBC: 6.1 10*3/uL (ref 4.0–10.5)

## 2013-03-29 NOTE — Patient Instructions (Addendum)
Your procedure is scheduled on: 03/31/2013  Enter through the Main Entrance of [facility] at: 12:00p  Pick up the phone at the desk and dial [phone].  Call this number if you have problems the morning of surgery: [phone].  Remember: Do NOT eat food: after midnight 08/26 Do NOT drink clear liquids after: 0930AM 03/31/2013 Take these medicines the morning of surgery with a SIP OF WATER: Methimazole  Do NOT wear jewelry, make-up, or nail polish. Do NOT wear lotions, powders, or perfumes.  You may wear deordrant. Do NOT shave for 48 hours prior to surgery. Do NOT bring valuables to the hospital. Contacts, dentures, or bridgework may not be worn into surgery. Leave suitcase in car.  After surgery it may be brought to your room.  For patients admitted to the hospital, checkout time is 11:00 AM the day of discharge.

## 2013-03-30 ENCOUNTER — Encounter (HOSPITAL_COMMUNITY): Payer: Self-pay | Admitting: Pharmacy Technician

## 2013-03-30 NOTE — H&P (Signed)
Christy Hill is an 57 y.o. female with postmenopausal bleeding.  Uterine ultrasound shows two fibroids questionably abutting the endometrial cavity and a possible polyp.  Endometrial bx was benign. She declines hysterectomy and would like conservative management.      Pertinent Gynecological History: Menses: post-menopausal Bleeding: post menopausal bleeding Contraception: post menopausal status DES exposure: unknown Blood transfusions: none Sexually transmitted diseases: no past history Previous GYN Procedures: C/S, endometrial ablation  Last mammogram: normal Date: 2014 Last pap: normal Date: 2013 OB History: G4, P3  Menstrual History: Menarche age: n/a No LMP recorded. Patient has had an ablation.    Past Medical History  Diagnosis Date  . Thyroid disease   . PONV (postoperative nausea and vomiting)     after all surgeries including colonoscopy  . Atrial fibrillation     secondary to thyroid  . Hyperthyroidism   . Asthma     no meds, years no problems  . Migraines     history  . Fibromyalgia   . Anemia     currently taking iron    Past Surgical History  Procedure Laterality Date  . Cesarean section    . Appendectomy    . Cholecystectomy    . Endometrial ablation    . Knee arthroscopy    . Breast implant removal    . Breast enhancement surgery    . Bunionectomy    . Colonoscopy      Family History  Problem Relation Age of Onset  . Depression Mother   . Cancer Father   . Dementia Father   . Depression Sister   . Depression Brother   . Pernicious anemia Paternal Aunt   . Pernicious anemia Paternal Grandmother   . Alcohol abuse Neg Hx   . Bipolar disorder Son     Social History:  reports that she has never smoked. She does not have any smokeless tobacco history on file. She reports that  drinks alcohol. She reports that she does not use illicit drugs.  Allergies: No Known Allergies  No prescriptions prior to admission    ROS  There were no vitals  taken for this visit. Physical Exam  Constitutional: She is oriented to person, place, and time. She appears well-developed and well-nourished.  Cardiovascular: Normal rate and regular rhythm.   Respiratory: Effort normal and breath sounds normal.  GI: Soft. There is no rebound and no guarding.  Neurological: She is alert and oriented to person, place, and time.  Skin: Skin is warm and dry.  Psychiatric: She has a normal mood and affect. Her behavior is normal.    No results found for this or any previous visit (from the past 24 hour(s)).  No results found.  Assessment/Plan: 57yo with postmenopausal bleeding -H/S, D&C, possible fibroid resection   Christy Hill 03/30/2013, 7:45 PM

## 2013-03-31 ENCOUNTER — Ambulatory Visit (HOSPITAL_COMMUNITY)
Admission: RE | Admit: 2013-03-31 | Discharge: 2013-03-31 | Disposition: A | Discharge status: Home / Self Care | Payer: 59 | Source: Ambulatory Visit | Attending: Obstetrics & Gynecology | Admitting: Obstetrics & Gynecology

## 2013-03-31 ENCOUNTER — Encounter (HOSPITAL_COMMUNITY): Payer: Self-pay | Admitting: Anesthesiology

## 2013-03-31 ENCOUNTER — Ambulatory Visit (HOSPITAL_COMMUNITY): Payer: 59 | Admitting: Anesthesiology

## 2013-03-31 ENCOUNTER — Encounter (HOSPITAL_COMMUNITY)
Admission: RE | Disposition: A | Discharge status: Home / Self Care | Payer: Self-pay | Source: Ambulatory Visit | Attending: Obstetrics & Gynecology

## 2013-03-31 DIAGNOSIS — E0590 Thyrotoxicosis, unspecified without thyrotoxic crisis or storm: Secondary | ICD-10-CM | POA: Insufficient documentation

## 2013-03-31 DIAGNOSIS — N950 Postmenopausal bleeding: Secondary | ICD-10-CM | POA: Insufficient documentation

## 2013-03-31 DIAGNOSIS — I4891 Unspecified atrial fibrillation: Secondary | ICD-10-CM | POA: Insufficient documentation

## 2013-03-31 DIAGNOSIS — D259 Leiomyoma of uterus, unspecified: Secondary | ICD-10-CM | POA: Insufficient documentation

## 2013-03-31 HISTORY — PX: DILATATION & CURRETTAGE/HYSTEROSCOPY WITH RESECTOCOPE: SHX5572

## 2013-03-31 SURGERY — DILATATION & CURETTAGE/HYSTEROSCOPY WITH RESECTOCOPE
Anesthesia: General | Site: Vagina | Wound class: Clean Contaminated

## 2013-03-31 MED ORDER — FENTANYL CITRATE 0.05 MG/ML IJ SOLN
INTRAMUSCULAR | Status: AC
  Filled 2013-03-31: qty 2

## 2013-03-31 MED ORDER — BUPIVACAINE HCL (PF) 0.5 % IJ SOLN
INTRAMUSCULAR | Status: AC
  Filled 2013-03-31: qty 30

## 2013-03-31 MED ORDER — FENTANYL CITRATE 0.05 MG/ML IJ SOLN
INTRAMUSCULAR | Status: DC | PRN
  Administered 2013-03-31: 100 ug via INTRAVENOUS

## 2013-03-31 MED ORDER — ONDANSETRON HCL 4 MG/2ML IJ SOLN
INTRAMUSCULAR | Status: AC
  Filled 2013-03-31: qty 2

## 2013-03-31 MED ORDER — ONDANSETRON HCL 4 MG/2ML IJ SOLN: 4.0000 mg | Freq: Once | INTRAMUSCULAR | Status: DC | PRN

## 2013-03-31 MED ORDER — MIDAZOLAM HCL 5 MG/5ML IJ SOLN
INTRAMUSCULAR | Status: DC | PRN
  Administered 2013-03-31: 2 mg via INTRAVENOUS

## 2013-03-31 MED ORDER — KETOROLAC TROMETHAMINE 30 MG/ML IJ SOLN
INTRAMUSCULAR | Status: AC
  Filled 2013-03-31: qty 1

## 2013-03-31 MED ORDER — LIDOCAINE HCL (CARDIAC) 20 MG/ML IV SOLN
INTRAVENOUS | Status: DC | PRN
  Administered 2013-03-31: 50 mg via INTRAVENOUS

## 2013-03-31 MED ORDER — FENTANYL CITRATE 0.05 MG/ML IJ SOLN: 25.0000 ug | INTRAMUSCULAR | Status: DC | PRN

## 2013-03-31 MED ORDER — IBUPROFEN 800 MG PO TABS: 800.0000 mg | ORAL_TABLET | Freq: Four times a day (QID) | ORAL | Status: DC | PRN

## 2013-03-31 MED ORDER — MIDAZOLAM HCL 2 MG/2ML IJ SOLN
INTRAMUSCULAR | Status: AC
  Filled 2013-03-31: qty 2

## 2013-03-31 MED ORDER — SODIUM CHLORIDE 0.9 % IR SOLN
Status: DC | PRN
  Administered 2013-03-31: 3000 mL

## 2013-03-31 MED ORDER — KETOROLAC TROMETHAMINE 30 MG/ML IJ SOLN: 15.0000 mg | Freq: Once | INTRAMUSCULAR | Status: DC | PRN

## 2013-03-31 MED ORDER — PROPOFOL 10 MG/ML IV BOLUS
INTRAVENOUS | Status: DC | PRN
  Administered 2013-03-31: 120 mg via INTRAVENOUS

## 2013-03-31 MED ORDER — KETOROLAC TROMETHAMINE 30 MG/ML IJ SOLN
INTRAMUSCULAR | Status: DC | PRN
  Administered 2013-03-31: 30 mg via INTRAVENOUS

## 2013-03-31 MED ORDER — DEXAMETHASONE SODIUM PHOSPHATE 10 MG/ML IJ SOLN
INTRAMUSCULAR | Status: AC
  Filled 2013-03-31: qty 1

## 2013-03-31 MED ORDER — OXYCODONE-ACETAMINOPHEN 7.5-325 MG PO TABS: 1.0000 | ORAL_TABLET | ORAL | Status: DC | PRN

## 2013-03-31 MED ORDER — LACTATED RINGERS IV SOLN
INTRAVENOUS | Status: DC
  Administered 2013-03-31: 13:00:00 via INTRAVENOUS

## 2013-03-31 MED ORDER — DEXTROSE IN LACTATED RINGERS 5 % IV SOLN: INTRAVENOUS | Status: DC

## 2013-03-31 MED ORDER — MEPERIDINE HCL 25 MG/ML IJ SOLN: 6.2500 mg | INTRAMUSCULAR | Status: DC | PRN

## 2013-03-31 MED ORDER — DEXAMETHASONE SODIUM PHOSPHATE 10 MG/ML IJ SOLN
INTRAMUSCULAR | Status: DC | PRN
  Administered 2013-03-31: 10 mg via INTRAVENOUS

## 2013-03-31 MED ORDER — GLYCINE 1.5 % IR SOLN
Status: DC | PRN
  Administered 2013-03-31 (×2): 3000 mL

## 2013-03-31 MED ORDER — PROPOFOL 10 MG/ML IV EMUL
INTRAVENOUS | Status: AC
  Filled 2013-03-31: qty 20

## 2013-03-31 MED ORDER — LACTATED RINGERS IV SOLN
INTRAVENOUS | Status: DC | PRN
  Administered 2013-03-31 (×2): via INTRAVENOUS

## 2013-03-31 MED ORDER — ONDANSETRON HCL 4 MG/2ML IJ SOLN
INTRAMUSCULAR | Status: DC | PRN
  Administered 2013-03-31: 4 mg via INTRAVENOUS

## 2013-03-31 MED ORDER — LIDOCAINE HCL (CARDIAC) 20 MG/ML IV SOLN
INTRAVENOUS | Status: AC
  Filled 2013-03-31: qty 5

## 2013-03-31 MED ORDER — SCOPOLAMINE 1 MG/3DAYS TD PT72
MEDICATED_PATCH | TRANSDERMAL | Status: AC
  Administered 2013-03-31: 1.5 mg
  Filled 2013-03-31: qty 1

## 2013-03-31 SURGICAL SUPPLY — 20 items
ABLATOR ENDOMETRIAL BIPOLAR (ABLATOR) IMPLANT
CANISTER SUCTION 2500CC (MISCELLANEOUS) ×5 IMPLANT
CATH ROBINSON RED A/P 16FR (CATHETERS) ×2 IMPLANT
CATH THERMACHOICE III (CATHETERS) IMPLANT
CLOTH BEACON ORANGE TIMEOUT ST (SAFETY) ×2 IMPLANT
CONTAINER PREFILL 10% NBF 60ML (FORM) ×4 IMPLANT
DRESSING TELFA 8X3 (GAUZE/BANDAGES/DRESSINGS) ×2 IMPLANT
ELECT REM PT RETURN 9FT ADLT (ELECTROSURGICAL) ×2
ELECTRODE REM PT RTRN 9FT ADLT (ELECTROSURGICAL) ×1 IMPLANT
ELECTRODE ROLLER VERSAPOINT (ELECTRODE) ×1 IMPLANT
ELECTRODE RT ANGLE VERSAPOINT (CUTTING LOOP) ×1 IMPLANT
GLOVE BIO SURGEON STRL SZ 6 (GLOVE) ×2 IMPLANT
GLOVE BIOGEL PI IND STRL 6 (GLOVE) ×2 IMPLANT
GLOVE BIOGEL PI INDICATOR 6 (GLOVE) ×2
GOWN STRL REIN XL XLG (GOWN DISPOSABLE) ×4 IMPLANT
LOOP ANGLED CUTTING 22FR (CUTTING LOOP) ×2 IMPLANT
PACK HYSTEROSCOPY LF (CUSTOM PROCEDURE TRAY) ×2 IMPLANT
PAD OB MATERNITY 4.3X12.25 (PERSONAL CARE ITEMS) ×2 IMPLANT
TOWEL OR 17X24 6PK STRL BLUE (TOWEL DISPOSABLE) ×4 IMPLANT
WATER STERILE IRR 1000ML POUR (IV SOLUTION) ×2 IMPLANT

## 2013-03-31 NOTE — Progress Notes (Signed)
No change to H&P. 

## 2013-03-31 NOTE — Anesthesia Preprocedure Evaluation (Signed)
Anesthesia Evaluation    Reviewed: Allergy & Precautions, H&P , NPO status , Patient's Chart, lab work & pertinent test results  Airway Mallampati: I TM Distance: >3 FB Neck ROM: full    Dental no notable dental hx. (+) Teeth Intact   Pulmonary    Pulmonary exam normal       Cardiovascular negative cardio ROS  + dysrhythmias Atrial Fibrillation     Neuro/Psych PSYCHIATRIC DISORDERS Depression    GI/Hepatic negative GI ROS, Neg liver ROS,   Endo/Other  Hyperthyroidism   Renal/GU negative Renal ROS     Musculoskeletal   Abdominal Normal abdominal exam  (+)   Peds  Hematology negative hematology ROS (+)   Anesthesia Other Findings   Reproductive/Obstetrics negative OB ROS                           Anesthesia Physical Anesthesia Plan  ASA: II  Anesthesia Plan: General   Post-op Pain Management:    Induction: Intravenous  Airway Management Planned: LMA  Additional Equipment:   Intra-op Plan:   Post-operative Plan:   Informed Consent: I have reviewed the patients History and Physical, chart, labs and discussed the procedure including the risks, benefits and alternatives for the proposed anesthesia with the patient or authorized representative who has indicated his/her understanding and acceptance.     Plan Discussed with: CRNA  Anesthesia Plan Comments: (Atrial fibrillation has been controlled by medical therapy for her hyperthyroidism.)        Anesthesia Quick Evaluation

## 2013-03-31 NOTE — Anesthesia Postprocedure Evaluation (Signed)
Anesthesia Post Note  Patient: Christy Hill  Procedure(s) Performed: Procedure(s) (LRB): DILATATION & CURETTAGE/HYSTEROSCOPY WITH RESECTOCOPE (N/A)  Anesthesia type: General  Patient location: PACU  Post pain: Pain level controlled  Post assessment: Post-op Vital signs reviewed  Last Vitals:  Filed Vitals:   03/31/13 1530  BP: 121/77  Pulse: 68  Temp:   Resp: 15    Post vital signs: Reviewed  Level of consciousness: sedated  Complications: No apparent anesthesia complications

## 2013-03-31 NOTE — Op Note (Signed)
PREOPERATIVE DIAGNOSIS:  Postmenopausal bleeding POSTOPERATIVE DIAGNOSIS: The same with intracavitary fibroid PROCEDURE: Hysteroscopy, Dilation and Curettage, resection of fibroid SURGEON:  Dr. Mitchel Honour   INDICATIONS: 57 y.o. [id number]  here for scheduled surgery for postmenopausal bleeding.   Risks of surgery were discussed with the patient including but not limited to: bleeding which may require transfusion; infection which may require antibiotics; injury to uterus or surrounding organs; intrauterine scarring which may impair future fertility; need for additional procedures including laparotomy or laparoscopy; and other postoperative/anesthesia complications. Written informed consent was obtained.    FINDINGS:  An 8 week size uterus.  Diffuse proliferative endometrium.  Large intracavitary fibroid filling the uterine cavity.  ANESTHESIA:   General ESTIMATED BLOOD LOSS:  50cc SPECIMENS: Endometrial curettings and fibroid resection sent to pathology COMPLICATIONS:  None immediate.  PROCEDURE DETAILS:  The patient was taken to the operating room where general anesthesia was administered and was found to be adequate.  After an adequate timeout was performed, she was placed in the dorsal lithotomy position and examined; then prepped and draped in the sterile manner.   Her bladder was catheterized for an unmeasured amount of clear, yellow urine. A speculum was then placed in the patient's vagina and a single tooth tenaculum was applied to the anterior lip of the cervix.  The uteus was unable to be sounded secondary to the  intracavitary fibroid.  The cervix was dilated manually with metal dilators to accommodate the 5.5 mm diagnostic hysteroscope.  Once the cervix was dilated, the hysteroscope was inserted under direct visualization using saline as a suspension medium.  The uterine cavity was carefully examined with the above listed findings.   After further careful visualization of the uterine cavity,  the hysteroscope was removed under direct visualization.  A sharp curettage was then performed to obtain a moderate amount of endometrial curettings.  The operative hysteroscope was assembled with the resectoscope.  The scope was advanced and the fibroid was resected in passes made distal to proximal to the scope being mindful of the boundaries of the uterine wall.  The fibroid was extremely hard and the resectoscope had difficulty incising the calcified tissue.  The resectoscope was traded out for the Versapoint.  This instrument also was unable to penetrate the tissue of the fibroid.  The roller ball was then used but without removal of tissue.  At the conclusion of the case, an estimated 25% of the fibroid had been removed.  The hysteroscope was removed and the tenaculum was removed from the anterior lip of the cervix and the vaginal speculum was removed after noting good hemostasis.  The patient tolerated the procedure well and was taken to the recovery area awake, extubated and in stable condition.

## 2013-03-31 NOTE — Transfer of Care (Signed)
Immediate Anesthesia Transfer of Care Note  Patient: Christy Hill  Procedure(s) Performed: Procedure(s): DILATATION & CURETTAGE/HYSTEROSCOPY WITH RESECTOCOPE (N/A)  Patient Location: PACU  Anesthesia Type:General  Level of Consciousness: awake, alert , oriented and patient cooperative  Airway & Oxygen Therapy: Patient Spontanous Breathing and Patient connected to nasal cannula oxygen  Post-op Assessment: Report given to PACU RN, Post -op Vital signs reviewed and stable and Patient moving all extremities X 4  Post vital signs: Reviewed and stable  Complications: No apparent anesthesia complications

## 2013-04-01 ENCOUNTER — Encounter (HOSPITAL_COMMUNITY): Payer: Self-pay | Admitting: Obstetrics & Gynecology

## 2013-04-05 ENCOUNTER — Encounter: Payer: Self-pay | Admitting: Internal Medicine

## 2013-04-05 DIAGNOSIS — E079 Disorder of thyroid, unspecified: Secondary | ICD-10-CM | POA: Insufficient documentation

## 2013-04-05 DIAGNOSIS — E0500 Thyrotoxicosis with diffuse goiter without thyrotoxic crisis or storm: Secondary | ICD-10-CM | POA: Insufficient documentation

## 2013-04-08 ENCOUNTER — Other Ambulatory Visit (HOSPITAL_COMMUNITY): Payer: 59

## 2013-06-10 ENCOUNTER — Other Ambulatory Visit: Payer: Self-pay

## 2013-07-12 ENCOUNTER — Other Ambulatory Visit: Payer: 59

## 2013-07-15 ENCOUNTER — Ambulatory Visit
Admission: RE | Admit: 2013-07-15 | Discharge: 2013-07-15 | Disposition: A | Discharge status: Home / Self Care | Payer: 59 | Source: Ambulatory Visit | Attending: Endocrinology | Admitting: Endocrinology

## 2013-07-15 DIAGNOSIS — E041 Nontoxic single thyroid nodule: Secondary | ICD-10-CM

## 2013-07-20 ENCOUNTER — Encounter: Payer: Self-pay | Admitting: Internal Medicine

## 2013-07-21 ENCOUNTER — Other Ambulatory Visit: Payer: Self-pay | Admitting: Endocrinology

## 2013-07-21 ENCOUNTER — Other Ambulatory Visit: Payer: Self-pay | Admitting: *Deleted

## 2013-07-21 DIAGNOSIS — E041 Nontoxic single thyroid nodule: Secondary | ICD-10-CM

## 2013-07-23 ENCOUNTER — Other Ambulatory Visit: Payer: Self-pay | Admitting: *Deleted

## 2013-07-23 MED ORDER — ZOLPIDEM TARTRATE 5 MG PO TABS: 5.0000 mg | ORAL_TABLET | Freq: Every evening | ORAL | Status: DC | PRN

## 2013-07-23 NOTE — Telephone Encounter (Signed)
Pt called concerned about flu states that she developed a cough last PM denies fever. Advised pt that if she developed fever to be seen at [facility] as soon as possible

## 2013-09-14 ENCOUNTER — Ambulatory Visit (HOSPITAL_BASED_OUTPATIENT_CLINIC_OR_DEPARTMENT_OTHER)
Admission: RE | Admit: 2013-09-14 | Discharge: 2013-09-14 | Disposition: A | Discharge status: Home / Self Care | Payer: 59 | Source: Ambulatory Visit | Attending: Internal Medicine | Admitting: Internal Medicine

## 2013-09-14 ENCOUNTER — Encounter: Payer: Self-pay | Admitting: Internal Medicine

## 2013-09-14 ENCOUNTER — Ambulatory Visit (INDEPENDENT_AMBULATORY_CARE_PROVIDER_SITE_OTHER): Payer: 59 | Admitting: Internal Medicine

## 2013-09-14 ENCOUNTER — Telehealth: Payer: Self-pay | Admitting: *Deleted

## 2013-09-14 VITALS — BP 107/67 | HR 62 | Temp 97.6°F | Resp 16 | Wt 143.0 lb

## 2013-09-14 DIAGNOSIS — M2550 Pain in unspecified joint: Secondary | ICD-10-CM

## 2013-09-14 DIAGNOSIS — M65849 Other synovitis and tenosynovitis, unspecified hand: Secondary | ICD-10-CM

## 2013-09-14 DIAGNOSIS — M65839 Other synovitis and tenosynovitis, unspecified forearm: Secondary | ICD-10-CM

## 2013-09-14 DIAGNOSIS — E0500 Thyrotoxicosis with diffuse goiter without thyrotoxic crisis or storm: Secondary | ICD-10-CM

## 2013-09-14 DIAGNOSIS — M25579 Pain in unspecified ankle and joints of unspecified foot: Secondary | ICD-10-CM | POA: Insufficient documentation

## 2013-09-14 DIAGNOSIS — M659 Synovitis and tenosynovitis, unspecified: Secondary | ICD-10-CM | POA: Insufficient documentation

## 2013-09-14 DIAGNOSIS — M79609 Pain in unspecified limb: Secondary | ICD-10-CM | POA: Insufficient documentation

## 2013-09-14 LAB — LIPID PANEL
CHOL/HDL RATIO: 3 ratio
Cholesterol: 196 mg/dL (ref 0–200)
HDL: 65 mg/dL (ref >39)
LDL Cholesterol: 119 mg/dL — ABNORMAL HIGH (ref 0–99)
Triglycerides: 62 mg/dL (ref <150)
VLDL: 12 mg/dL (ref 0–40)

## 2013-09-14 LAB — CBC WITH DIFFERENTIAL/PLATELET
BASOS ABS: 0 10*3/uL (ref 0.0–0.1)
BASOS PCT: 0 % (ref 0–1)
Eosinophils Absolute: 0.1 10*3/uL (ref 0.0–0.7)
Eosinophils Relative: 3 % (ref 0–5)
HEMATOCRIT: 43 % (ref 36.0–46.0)
Hemoglobin: 14.7 g/dL (ref 12.0–15.0)
Lymphocytes Relative: 32 % (ref 12–46)
Lymphs Abs: 1.6 10*3/uL (ref 0.7–4.0)
MCH: 30.8 pg (ref 26.0–34.0)
MCHC: 34.2 g/dL (ref 30.0–36.0)
MCV: 90.1 fL (ref 78.0–100.0)
Monocytes Absolute: 0.4 10*3/uL (ref 0.1–1.0)
Monocytes Relative: 7 % (ref 3–12)
NEUTROS ABS: 2.8 10*3/uL (ref 1.7–7.7)
Neutrophils Relative %: 58 % (ref 43–77)
Platelets: 258 10*3/uL (ref 150–400)
RBC: 4.77 MIL/uL (ref 3.87–5.11)
RDW: 14.9 % (ref 11.5–15.5)
WBC: 4.9 10*3/uL (ref 4.0–10.5)

## 2013-09-14 LAB — COMPREHENSIVE METABOLIC PANEL
ALBUMIN: 4.1 g/dL (ref 3.5–5.2)
ALT: 17 U/L (ref 0–35)
AST: 21 U/L (ref 0–37)
Alkaline Phosphatase: 61 U/L (ref 39–117)
BUN: 14 mg/dL (ref 6–23)
CO2: 29 mEq/L (ref 19–32)
CREATININE: 0.78 mg/dL (ref 0.50–1.10)
Calcium: 9.5 mg/dL (ref 8.4–10.5)
Chloride: 103 mEq/L (ref 96–112)
Glucose, Bld: 86 mg/dL (ref 70–99)
POTASSIUM: 4.8 meq/L (ref 3.5–5.3)
Sodium: 139 mEq/L (ref 135–145)
Total Bilirubin: 0.5 mg/dL (ref 0.2–1.2)
Total Protein: 6.6 g/dL (ref 6.0–8.3)

## 2013-09-14 LAB — RHEUMATOID FACTOR: Rhuematoid fact SerPl-aCnc: <10 IU/mL (ref <=14)

## 2013-09-14 LAB — URIC ACID: Uric Acid, Serum: 3 mg/dL (ref 2.4–7.0)

## 2013-09-14 NOTE — Telephone Encounter (Signed)
Message copied by Conley Rolls on Tue Sep 14, 2013  4:17 PM ------      Message from: Emi Belfast D      Created: Tue Sep 14, 2013  1:41 PM       Jolayne Haines            Call pt and let her know that her ankle and R ight hand show no active arthritis ------

## 2013-09-14 NOTE — Progress Notes (Addendum)
Subjective:    Patient ID: Christy Hill, female    DOB: August 17, 1955, 58 y.o.   MRN: [id number]  HPI [name] is here for acute visit.  I have not seen her since 06/2012.   She has had a completed course of her Graves toxicosis  Including A fib on presentation,   Graves opthalmopathy.  She is seeing a  Dr. Aline August at [facility] for this.    Graves :   She reports she is not off her Methimazole for the past 3 weeks  - management by Dr. Chalmers Cater  She has also had a D and C for menorrhagia.  Menses more controlled now  She is concerned over painful swollen joints in her R index finger and R ankle.  R index finger gets red and swells,  R ankle is painful but no redness or edema.  No recent trauma.   She would like to see a rheumatologist about this as she is concerned about an auto-immune etiology.    Allergies  Allergen Reactions  . Gluten Meal    Past Medical History  Diagnosis Date  . Thyroid disease   . PONV (postoperative nausea and vomiting)     after all surgeries including colonoscopy  . Atrial fibrillation     secondary to thyroid  . Hyperthyroidism   . Asthma     no meds, years no problems  . Migraines     history  . Fibromyalgia   . Anemia     currently taking iron   Past Surgical History  Procedure Laterality Date  . Cesarean section    . Appendectomy    . Cholecystectomy    . Endometrial ablation    . Knee arthroscopy    . Breast implant removal    . Breast enhancement surgery    . Bunionectomy    . Colonoscopy    . Dilatation & currettage/hysteroscopy with resectocope N/A 03/31/2013    Procedure: DILATATION & CURETTAGE/HYSTEROSCOPY WITH RESECTOCOPE;  Surgeon: Linda Hedges, DO;  Location: [facility];  Service: Gynecology;  Laterality: N/A;   History   Social History  . Marital Status: Married    Spouse Name: N/A    Number of Children: N/A  . Years of Education: N/A   Occupational History  . Not on file.   Social History Main Topics  . Smoking status: Never Smoker     . Smokeless tobacco: Not on file  . Alcohol Use: Yes     Comment: HAD ETOH ON TUESDAY  . Drug Use: No  . Sexual Activity: Yes   Other Topics Concern  . Not on file   Social History Narrative  . No narrative on file   Family History  Problem Relation Age of Onset  . Depression Mother   . Dementia Mother   . Cancer Father   . Dementia Father   . Depression Sister   . Depression Brother   . Pernicious anemia Paternal Aunt   . Pernicious anemia Paternal Grandmother   . Alcohol abuse Neg Hx   . Bipolar disorder Son    Patient Active Problem List   Diagnosis Date Noted  . Graves' ophthalmopathy 09/14/2013  . Synovitis of finger 09/14/2013  . Thyroid eye disease 04/05/2013  . Vaginal spotting 12/10/2012  . Hyperthyroidism 06/22/2012  . History of fibromyalgia 06/21/2012  . Allergic rhinitis 06/21/2012  . Menopause 06/11/2012  . History of migraine headaches 06/11/2012  . Depression 06/11/2012  . S/P cholecystectomy 06/11/2012  . S/P  exploratory laparotomy 06/11/2012  . New onset atrial fibrillation 06/11/2012   Current Outpatient Prescriptions on File Prior to Visit  Medication Sig Dispense Refill  . ibuprofen (ADVIL) 800 MG tablet Take 1 tablet (800 mg total) by mouth every 6 (six) hours as needed for pain.  30 tablet  0  . zolpidem (AMBIEN) 5 MG tablet Take 1 tablet (5 mg total) by mouth at bedtime as needed for sleep.  15 tablet  1   No current facility-administered medications on file prior to visit.       Review of Systems See HPI    Objective:   Physical Exam  Physical Exam  Nursing note and vitals reviewed.  Constitutional: She is oriented to person, place, and time. She appears well-developed and well-nourished.  HENT:  Head: Normocephalic and atraumatic.  Cardiovascular: Normal rate and regular rhythm. Exam reveals no gallop and no friction rub.  No murmur heard.  Pulmonary/Chest: Breath sounds normal. She has no wheezes. She has no rales.   Neurological: She is alert and oriented to person, place, and time.  M/S  R index finger  Swollen  No redness today. R ankle no edema pain with dorsiflexion  No active synovitis on exam.  No other clinical synovitis to any other joint Skin: Skin is warm and dry.  Psychiatric: She has a normal mood and affect. Her behavior is normal.        Assessment & Plan:  Arthralgias  Given complicated auto-immune history with Graves will get  Imaging and preliminary arthritis panel.  OK to see rheumatologist which pt requests.     Graves toxicosis  Off methimazole now  Managed Dr. Ferman Hamming opthalmopathy: She see specialist at [facility]  Further management based on results  Addenedum  Preliminary arthritis panel neg.  Xray neg

## 2013-09-14 NOTE — Addendum Note (Signed)
Addended by: Conley Rolls on: 09/14/2013 09:44 AM   Modules accepted: Orders

## 2013-09-14 NOTE — Telephone Encounter (Signed)
Notified pt of x ray results via VMM.

## 2013-09-14 NOTE — Patient Instructions (Signed)
Will refer to Dr. Gavin Pound  Get blood today  To xray today

## 2013-09-15 LAB — ANA: ANA: NEGATIVE

## 2013-09-15 LAB — VITAMIN D 25 HYDROXY (VIT D DEFICIENCY, FRACTURES): VIT D 25 HYDROXY: 84 ng/mL (ref 30–89)

## 2013-09-15 LAB — SEDIMENTATION RATE: Sed Rate: 4 mm/hr (ref 0–22)

## 2013-09-16 ENCOUNTER — Telehealth: Payer: Self-pay | Admitting: *Deleted

## 2013-09-16 ENCOUNTER — Telehealth: Payer: Self-pay | Admitting: Internal Medicine

## 2013-09-16 NOTE — Telephone Encounter (Signed)
Christy Hill wants to know her lab results & follow up options

## 2013-09-20 ENCOUNTER — Telehealth: Payer: Self-pay | Admitting: Internal Medicine

## 2013-09-20 NOTE — Telephone Encounter (Signed)
Left a second message for pt to call office regarding her lab results

## 2013-09-20 NOTE — Telephone Encounter (Signed)
Pt informed of lab results.    She does not wish referral to rheumatology ata this time  Advised to take 600 mg of Ibuprofen bid for 7-10 days - notify office if any worsening joint pain, redness, swelling.  She voices understanding

## 2013-11-02 ENCOUNTER — Telehealth: Payer: Self-pay | Admitting: *Deleted

## 2013-11-02 NOTE — Telephone Encounter (Signed)
Christy Hill is going on a cruise and would like you to call in the patch for her for motion sickness.

## 2013-11-03 NOTE — Telephone Encounter (Signed)
See Heather's note 

## 2013-11-08 ENCOUNTER — Other Ambulatory Visit: Payer: Self-pay | Admitting: Internal Medicine

## 2013-11-08 MED ORDER — SCOPOLAMINE 1 MG/3DAYS TD PT72: 1.0000 | MEDICATED_PATCH | TRANSDERMAL | Status: DC

## 2013-11-08 NOTE — Telephone Encounter (Signed)
Christy Hill  Call pt and tell her to check with her opthalmologist if scopolamine patch is OK  To use because of her thyroid eye disease.  Scopolamine can cause blurred vision

## 2013-11-08 NOTE — Telephone Encounter (Signed)
Notified pt that she will need to call her ophthalmologist before prescribing the patch pt will call us back

## 2013-11-08 NOTE — Telephone Encounter (Signed)
LVM message letting pt know that patches have been sent in to pharmacy

## 2013-12-03 ENCOUNTER — Other Ambulatory Visit: Payer: Self-pay

## 2013-12-03 DIAGNOSIS — Z1231 Encounter for screening mammogram for malignant neoplasm of breast: Secondary | ICD-10-CM

## 2013-12-16 ENCOUNTER — Ambulatory Visit
Admission: RE | Admit: 2013-12-16 | Discharge: 2013-12-16 | Disposition: A | Discharge status: Home / Self Care | Payer: 59 | Source: Ambulatory Visit

## 2013-12-16 DIAGNOSIS — Z1231 Encounter for screening mammogram for malignant neoplasm of breast: Secondary | ICD-10-CM

## 2014-01-19 ENCOUNTER — Other Ambulatory Visit: Payer: 59

## 2014-01-20 ENCOUNTER — Other Ambulatory Visit: Payer: 59

## 2014-01-20 ENCOUNTER — Ambulatory Visit (HOSPITAL_BASED_OUTPATIENT_CLINIC_OR_DEPARTMENT_OTHER)
Admission: RE | Admit: 2014-01-20 | Discharge: 2014-01-20 | Disposition: A | Discharge status: Home / Self Care | Payer: 59 | Source: Ambulatory Visit | Attending: Endocrinology | Admitting: Endocrinology

## 2014-01-20 DIAGNOSIS — E041 Nontoxic single thyroid nodule: Secondary | ICD-10-CM

## 2014-01-20 DIAGNOSIS — E042 Nontoxic multinodular goiter: Secondary | ICD-10-CM | POA: Insufficient documentation

## 2014-05-05 ENCOUNTER — Encounter: Payer: Self-pay | Admitting: *Deleted

## 2014-05-20 ENCOUNTER — Other Ambulatory Visit: Payer: Self-pay

## 2014-05-26 ENCOUNTER — Ambulatory Visit (INDEPENDENT_AMBULATORY_CARE_PROVIDER_SITE_OTHER): Payer: 59 | Admitting: *Deleted

## 2014-05-26 DIAGNOSIS — Z23 Encounter for immunization: Secondary | ICD-10-CM

## 2014-06-06 ENCOUNTER — Encounter: Payer: Self-pay | Admitting: Internal Medicine

## 2014-12-08 ENCOUNTER — Encounter (HOSPITAL_COMMUNITY): Payer: Self-pay

## 2014-12-08 ENCOUNTER — Encounter (HOSPITAL_COMMUNITY)
Admission: RE | Admit: 2014-12-08 | Discharge: 2014-12-08 | Disposition: A | Discharge status: Home / Self Care | Payer: 59 | Source: Ambulatory Visit | Attending: Obstetrics and Gynecology | Admitting: Obstetrics and Gynecology

## 2014-12-08 ENCOUNTER — Other Ambulatory Visit: Payer: Self-pay

## 2014-12-08 DIAGNOSIS — Z01818 Encounter for other preprocedural examination: Secondary | ICD-10-CM | POA: Insufficient documentation

## 2014-12-08 HISTORY — DX: Adverse effect of unspecified general anesthetics, initial encounter: T41.205A

## 2014-12-08 LAB — CBC
HEMATOCRIT: 41.5 % (ref 36.0–46.0)
Hemoglobin: 14.1 g/dL (ref 12.0–15.0)
MCH: 32 pg (ref 26.0–34.0)
MCHC: 34 g/dL (ref 30.0–36.0)
MCV: 94.1 fL (ref 78.0–100.0)
PLATELETS: 261 10*3/uL (ref 150–400)
RBC: 4.41 MIL/uL (ref 3.87–5.11)
RDW: 12.9 % (ref 11.5–15.5)
WBC: 5.4 10*3/uL (ref 4.0–10.5)

## 2014-12-08 NOTE — Patient Instructions (Addendum)
   Your procedure is scheduled on: MAY 17 AT 230PM (TUESDAY)  Enter through the Main Entrance of [facility] at: [place] up the phone at the desk and dial [phone] and inform us of your arrival.  Please call this number if you have any problems the morning of surgery: [phone]  Remember: Do not eat food after midnight: Monday Night  Do not drink clear liquids after: [place] Take these medicines the morning of surgery with a SIP OF WATER: NONE DAY OF SURGERY  Do not wear jewelry, make-up, or FINGER nail polish No metal in your hair or on your body. Do not wear lotions, powders, perfumes.  You may wear deodorant.  Do not bring valuables to the hospital. Contacts, dentures or bridgework may not be worn into surgery.  Leave suitcase in the car. After Surgery it may be brought to your room. For patients being admitted to the hospital, checkout time is 11:00am the day of discharge.    Patients discharged on the day of surgery will not be allowed to drive home.

## 2014-12-09 ENCOUNTER — Other Ambulatory Visit: Payer: Self-pay | Admitting: Obstetrics and Gynecology

## 2014-12-12 NOTE — H&P (Signed)
NAMEANGELETTE, Hill NO.:  [id number]  MEDICAL RECORD NO.:  [id number]  LOCATION:  PERIO                         FACILITY:  [facility]  PHYSICIAN:  Lovenia Kim, M.D.DATE OF BIRTH:  October 23, 1955  DATE OF ADMISSION:  12/06/2014 DATE OF DISCHARGE:                             HISTORY & PHYSICAL   CHIEF COMPLAINT:  Abnormal uterine bleeding.  HISTORY OF PRESENT ILLNESS:  The patient is a 59 year old white female, G4, P2 with a history of diagnostic hysteroscopy in 2014, postmenopausal bleeding, negative pathology, history of failed endometrial ablation, now with recurrent bleeding .Endometrial biopsy performed on Dec 12, 2014, for definitive surgery.    She has no known drug allergies.  MEDICATIONS:   progesterone, multivitamin,   PAST SURGICAL HISTORY:  Remarkable for D and C with hysteroscopy in 2014, cholecystectomy in 2003, breast augmentation in 1989, tubal ligation in 1988, cesarean section x2, appendectomy, exploratory laparotomy   She is nonsmoker and nondrinker.  She denies domestic or physical violence.  FAMILY HISTORY:  Remarkable for heart disease, colon cancer, hypertension, diabetes.  PHYSICAL EXAMINATION:  GENERAL:  She is a well-developed and well- nourished white female, with acute distress. VITAL SIGNS:  WNL HEENT:  Normal. NECK:  Supple.  Full range of motion. LUNGS:  Clear. HEART:  Regular rate and rhythm. ABDOMEN:  Soft, nontender.  No evidence of mass. EXTREMITIES:  There are no cords. NEUROLOGIC:  Nonfocal. SKIN:  Intact.  IMPRESSION:  Symptomatic fibroids with recurrent bleeding.  Normal endometrial biopsy for definitive therapy, failed endometrial ablation noted.  Plan is to proceed with da Vinci-assisted total laparoscopic Hysterectomy, and bilateral salpingectomy.  The patient acknowleges the  Risks of anesthesia, infection, bleeding, injury to surrounding organs, and possible need for repair was discussed.  Delayed versus  immediate complications to include bowel and bladder injury are discussed.  The patient acknowledges and wishes to proceed.     Lovenia Kim, M.D.     RJT/MEDQ  D:  12/12/2014  T:  12/12/2014  Job:  [id number]

## 2014-12-13 ENCOUNTER — Encounter (HOSPITAL_COMMUNITY)
Admission: RE | Disposition: A | Discharge status: Home / Self Care | Payer: Self-pay | Source: Ambulatory Visit | Attending: Obstetrics and Gynecology

## 2014-12-13 ENCOUNTER — Ambulatory Visit (HOSPITAL_COMMUNITY): Payer: 59 | Admitting: Certified Registered Nurse Anesthetist

## 2014-12-13 ENCOUNTER — Ambulatory Visit (HOSPITAL_COMMUNITY)
Admission: RE | Admit: 2014-12-13 | Discharge: 2014-12-14 | Disposition: A | Discharge status: Home / Self Care | Payer: 59 | Source: Ambulatory Visit | Attending: Obstetrics and Gynecology | Admitting: Obstetrics and Gynecology

## 2014-12-13 DIAGNOSIS — F329 Major depressive disorder, single episode, unspecified: Secondary | ICD-10-CM | POA: Diagnosis not present

## 2014-12-13 DIAGNOSIS — E0590 Thyrotoxicosis, unspecified without thyrotoxic crisis or storm: Secondary | ICD-10-CM | POA: Insufficient documentation

## 2014-12-13 DIAGNOSIS — J45909 Unspecified asthma, uncomplicated: Secondary | ICD-10-CM | POA: Insufficient documentation

## 2014-12-13 DIAGNOSIS — D259 Leiomyoma of uterus, unspecified: Secondary | ICD-10-CM | POA: Insufficient documentation

## 2014-12-13 DIAGNOSIS — N946 Dysmenorrhea, unspecified: Secondary | ICD-10-CM | POA: Diagnosis present

## 2014-12-13 DIAGNOSIS — N939 Abnormal uterine and vaginal bleeding, unspecified: Secondary | ICD-10-CM | POA: Diagnosis present

## 2014-12-13 DIAGNOSIS — Z9049 Acquired absence of other specified parts of digestive tract: Secondary | ICD-10-CM | POA: Insufficient documentation

## 2014-12-13 DIAGNOSIS — N736 Female pelvic peritoneal adhesions (postinfective): Secondary | ICD-10-CM | POA: Diagnosis not present

## 2014-12-13 DIAGNOSIS — F419 Anxiety disorder, unspecified: Secondary | ICD-10-CM | POA: Insufficient documentation

## 2014-12-13 HISTORY — PX: BILATERAL SALPINGECTOMY: SHX5743

## 2014-12-13 HISTORY — PX: ROBOTIC ASSISTED TOTAL HYSTERECTOMY: SHX6085

## 2014-12-13 LAB — BASIC METABOLIC PANEL
ANION GAP: 4 — AB (ref 5–15)
BUN: 11 mg/dL (ref 6–20)
CHLORIDE: 107 mmol/L (ref 101–111)
CO2: 28 mmol/L (ref 22–32)
CREATININE: 0.91 mg/dL (ref 0.44–1.00)
Calcium: 9.2 mg/dL (ref 8.9–10.3)
GFR calc Af Amer: >60 mL/min (ref >60)
GFR calc non Af Amer: >60 mL/min (ref >60)
GLUCOSE: 96 mg/dL (ref 70–99)
Potassium: 4.3 mmol/L (ref 3.5–5.1)
Sodium: 139 mmol/L (ref 135–145)

## 2014-12-13 LAB — TYPE AND SCREEN
ABO/RH(D): O POS
Antibody Screen: NEGATIVE

## 2014-12-13 LAB — ABO/RH: ABO/RH(D): O POS

## 2014-12-13 SURGERY — ROBOTIC ASSISTED TOTAL HYSTERECTOMY
Anesthesia: General | Site: Abdomen

## 2014-12-13 MED ORDER — DEXTROSE IN LACTATED RINGERS 5 % IV SOLN
INTRAVENOUS | Status: DC
  Administered 2014-12-13 – 2014-12-14 (×2): via INTRAVENOUS

## 2014-12-13 MED ORDER — SODIUM CHLORIDE 0.9 % IJ SOLN
INTRAMUSCULAR | Status: AC
  Filled 2014-12-13: qty 50

## 2014-12-13 MED ORDER — BUPIVACAINE HCL (PF) 0.25 % IJ SOLN
INTRAMUSCULAR | Status: AC
  Filled 2014-12-13: qty 10

## 2014-12-13 MED ORDER — SCOPOLAMINE 1 MG/3DAYS TD PT72: 1.0000 | MEDICATED_PATCH | Freq: Once | TRANSDERMAL | Status: DC

## 2014-12-13 MED ORDER — ARTIFICIAL TEARS OP OINT
TOPICAL_OINTMENT | OPHTHALMIC | Status: AC
  Filled 2014-12-13: qty 3.5

## 2014-12-13 MED ORDER — ROCURONIUM BROMIDE 100 MG/10ML IV SOLN
INTRAVENOUS | Status: AC
  Filled 2014-12-13: qty 1

## 2014-12-13 MED ORDER — TRAMADOL HCL 50 MG PO TABS
50.0000 mg | ORAL_TABLET | Freq: Four times a day (QID) | ORAL | Status: DC | PRN
  Administered 2014-12-14: 50 mg via ORAL
  Filled 2014-12-13: qty 1

## 2014-12-13 MED ORDER — HYDROMORPHONE HCL 1 MG/ML IJ SOLN
INTRAMUSCULAR | Status: AC
  Filled 2014-12-13: qty 1

## 2014-12-13 MED ORDER — MIDAZOLAM HCL 2 MG/2ML IJ SOLN
INTRAMUSCULAR | Status: DC | PRN
  Administered 2014-12-13: 1.5 mg via INTRAVENOUS
  Administered 2014-12-13: 0.5 mg via INTRAVENOUS

## 2014-12-13 MED ORDER — BUPIVACAINE LIPOSOME 1.3 % IJ SUSP
INTRAMUSCULAR | Status: DC | PRN
  Administered 2014-12-13: 20 mL

## 2014-12-13 MED ORDER — GLYCOPYRROLATE 0.2 MG/ML IJ SOLN
INTRAMUSCULAR | Status: AC
  Filled 2014-12-13: qty 3

## 2014-12-13 MED ORDER — KETOROLAC TROMETHAMINE 30 MG/ML IJ SOLN
INTRAMUSCULAR | Status: AC
  Filled 2014-12-13: qty 1

## 2014-12-13 MED ORDER — PROPOFOL 10 MG/ML IV BOLUS
INTRAVENOUS | Status: AC
  Filled 2014-12-13: qty 20

## 2014-12-13 MED ORDER — GLYCOPYRROLATE 0.2 MG/ML IJ SOLN
INTRAMUSCULAR | Status: DC | PRN
  Administered 2014-12-13: 0.6 mg via INTRAVENOUS

## 2014-12-13 MED ORDER — FENTANYL CITRATE (PF) 100 MCG/2ML IJ SOLN
25.0000 ug | INTRAMUSCULAR | Status: DC | PRN
  Administered 2014-12-13: 50 ug via INTRAVENOUS

## 2014-12-13 MED ORDER — DIPHENHYDRAMINE HCL 12.5 MG/5ML PO ELIX: 12.5000 mg | ORAL_SOLUTION | Freq: Four times a day (QID) | ORAL | Status: DC | PRN

## 2014-12-13 MED ORDER — SODIUM CHLORIDE 0.9 % IJ SOLN: 9.0000 mL | INTRAMUSCULAR | Status: DC | PRN

## 2014-12-13 MED ORDER — NALOXONE HCL 0.4 MG/ML IJ SOLN: 0.4000 mg | INTRAMUSCULAR | Status: DC | PRN

## 2014-12-13 MED ORDER — HYDROMORPHONE 0.3 MG/ML IV SOLN
INTRAVENOUS | Status: DC
  Administered 2014-12-13: 18:00:00 via INTRAVENOUS
  Filled 2014-12-13: qty 25

## 2014-12-13 MED ORDER — LIDOCAINE HCL (CARDIAC) 20 MG/ML IV SOLN
INTRAVENOUS | Status: AC
  Filled 2014-12-13: qty 5

## 2014-12-13 MED ORDER — LIDOCAINE HCL (CARDIAC) 20 MG/ML IV SOLN
INTRAVENOUS | Status: DC | PRN
  Administered 2014-12-13: 50 mg via INTRAVENOUS

## 2014-12-13 MED ORDER — HYDROMORPHONE HCL 1 MG/ML IJ SOLN
INTRAMUSCULAR | Status: DC | PRN
  Administered 2014-12-13: 1 mg via INTRAVENOUS

## 2014-12-13 MED ORDER — OXYCODONE-ACETAMINOPHEN 5-325 MG PO TABS
1.0000 | ORAL_TABLET | ORAL | Status: DC | PRN
  Administered 2014-12-14 (×2): 1 via ORAL
  Filled 2014-12-13 (×2): qty 1

## 2014-12-13 MED ORDER — BUPIVACAINE HCL (PF) 0.25 % IJ SOLN: INTRAMUSCULAR | Status: DC | PRN

## 2014-12-13 MED ORDER — FENTANYL CITRATE (PF) 250 MCG/5ML IJ SOLN
INTRAMUSCULAR | Status: AC
  Filled 2014-12-13: qty 5

## 2014-12-13 MED ORDER — NEOSTIGMINE METHYLSULFATE 10 MG/10ML IV SOLN
INTRAVENOUS | Status: DC | PRN
  Administered 2014-12-13: 2.5 mg via INTRAVENOUS

## 2014-12-13 MED ORDER — ROCURONIUM BROMIDE 100 MG/10ML IV SOLN
INTRAVENOUS | Status: DC | PRN
  Administered 2014-12-13: 50 mg via INTRAVENOUS
  Administered 2014-12-13: 20 mg via INTRAVENOUS
  Administered 2014-12-13: 5 mg via INTRAVENOUS

## 2014-12-13 MED ORDER — PROPOFOL 10 MG/ML IV BOLUS
INTRAVENOUS | Status: DC | PRN
  Administered 2014-12-13: 120 mg via INTRAVENOUS

## 2014-12-13 MED ORDER — SODIUM CHLORIDE 0.9 % IV SOLN
INTRAVENOUS | Status: DC | PRN
  Administered 2014-12-13: 60 mL

## 2014-12-13 MED ORDER — NEOSTIGMINE METHYLSULFATE 10 MG/10ML IV SOLN
INTRAVENOUS | Status: AC
  Filled 2014-12-13: qty 1

## 2014-12-13 MED ORDER — SODIUM CHLORIDE 0.9 % IJ SOLN
INTRAMUSCULAR | Status: AC
  Filled 2014-12-13: qty 10

## 2014-12-13 MED ORDER — DEXAMETHASONE SODIUM PHOSPHATE 10 MG/ML IJ SOLN
INTRAMUSCULAR | Status: DC | PRN
  Administered 2014-12-13: 5 mg via INTRAVENOUS

## 2014-12-13 MED ORDER — ONDANSETRON HCL 4 MG/2ML IJ SOLN: 4.0000 mg | Freq: Once | INTRAMUSCULAR | Status: DC | PRN

## 2014-12-13 MED ORDER — DIPHENHYDRAMINE HCL 50 MG/ML IJ SOLN: 12.5000 mg | Freq: Four times a day (QID) | INTRAMUSCULAR | Status: DC | PRN

## 2014-12-13 MED ORDER — ONDANSETRON HCL 4 MG/2ML IJ SOLN: 4.0000 mg | Freq: Four times a day (QID) | INTRAMUSCULAR | Status: DC | PRN

## 2014-12-13 MED ORDER — ONDANSETRON HCL 4 MG/2ML IJ SOLN
INTRAMUSCULAR | Status: AC
  Filled 2014-12-13: qty 2

## 2014-12-13 MED ORDER — ROPIVACAINE HCL 5 MG/ML IJ SOLN
INTRAMUSCULAR | Status: AC
  Filled 2014-12-13: qty 30

## 2014-12-13 MED ORDER — FENTANYL CITRATE (PF) 100 MCG/2ML IJ SOLN
INTRAMUSCULAR | Status: DC | PRN
  Administered 2014-12-13 (×5): 50 ug via INTRAVENOUS

## 2014-12-13 MED ORDER — CEFAZOLIN SODIUM-DEXTROSE 2-3 GM-% IV SOLR
2.0000 g | INTRAVENOUS | Status: AC
  Administered 2014-12-13: 2 g via INTRAVENOUS

## 2014-12-13 MED ORDER — KETOROLAC TROMETHAMINE 30 MG/ML IJ SOLN
INTRAMUSCULAR | Status: DC | PRN
  Administered 2014-12-13: 30 mg via INTRAVENOUS

## 2014-12-13 MED ORDER — BUPIVACAINE LIPOSOME 1.3 % IJ SUSP
20.0000 mL | Freq: Once | INTRAMUSCULAR | Status: DC
  Filled 2014-12-13: qty 20

## 2014-12-13 MED ORDER — GLYCOPYRROLATE 0.2 MG/ML IJ SOLN
INTRAMUSCULAR | Status: AC
  Filled 2014-12-13: qty 1

## 2014-12-13 MED ORDER — MIDAZOLAM HCL 2 MG/2ML IJ SOLN
INTRAMUSCULAR | Status: AC
  Filled 2014-12-13: qty 2

## 2014-12-13 MED ORDER — ONDANSETRON HCL 4 MG/2ML IJ SOLN
INTRAMUSCULAR | Status: DC | PRN
  Administered 2014-12-13: 4 mg via INTRAVENOUS

## 2014-12-13 MED ORDER — DEXAMETHASONE SODIUM PHOSPHATE 10 MG/ML IJ SOLN
INTRAMUSCULAR | Status: AC
  Filled 2014-12-13: qty 1

## 2014-12-13 MED ORDER — ARTIFICIAL TEARS OP OINT
TOPICAL_OINTMENT | OPHTHALMIC | Status: DC | PRN
  Administered 2014-12-13: 1 via OPHTHALMIC

## 2014-12-13 MED ORDER — FENTANYL CITRATE (PF) 100 MCG/2ML IJ SOLN
INTRAMUSCULAR | Status: AC
Start: 2014-12-13 — End: 2014-12-14
  Filled 2014-12-13: qty 2

## 2014-12-13 MED ORDER — LACTATED RINGERS IR SOLN
Status: DC | PRN
  Administered 2014-12-13: 3000 mL

## 2014-12-13 MED ORDER — LACTATED RINGERS IV SOLN
INTRAVENOUS | Status: DC
  Administered 2014-12-13 (×2): via INTRAVENOUS

## 2014-12-13 SURGICAL SUPPLY — 58 items
BARRIER ADHS 3X4 INTERCEED (GAUZE/BANDAGES/DRESSINGS) ×4 IMPLANT
BRR ADH 4X3 ABS CNTRL BYND (GAUZE/BANDAGES/DRESSINGS) ×2
CATH FOLEY 3WAY  5CC 16FR (CATHETERS) ×2
CATH FOLEY 3WAY 5CC 16FR (CATHETERS) ×2 IMPLANT
CLOTH BEACON ORANGE TIMEOUT ST (SAFETY) ×4 IMPLANT
CONT PATH 16OZ SNAP LID 3702 (MISCELLANEOUS) ×4 IMPLANT
COVER BACK TABLE 60X90IN (DRAPES) ×8 IMPLANT
COVER TIP SHEARS 8 DVNC (MISCELLANEOUS) ×2 IMPLANT
COVER TIP SHEARS 8MM DA VINCI (MISCELLANEOUS) ×2
DECANTER SPIKE VIAL GLASS SM (MISCELLANEOUS) ×16 IMPLANT
DRAPE WARM FLUID 44X44 (DRAPE) ×4 IMPLANT
DRSG COVADERM PLUS 2X2 (GAUZE/BANDAGES/DRESSINGS) ×8 IMPLANT
DRSG OPSITE POSTOP 3X4 (GAUZE/BANDAGES/DRESSINGS) ×2 IMPLANT
DURAPREP 26ML APPLICATOR (WOUND CARE) ×4 IMPLANT
ELECT REM PT RETURN 9FT ADLT (ELECTROSURGICAL) ×4
ELECTRODE REM PT RTRN 9FT ADLT (ELECTROSURGICAL) ×2 IMPLANT
GAUZE VASELINE 3X9 (GAUZE/BANDAGES/DRESSINGS) IMPLANT
GLOVE BIO SURGEON STRL SZ7.5 (GLOVE) ×8 IMPLANT
GLOVE BIOGEL PI IND STRL 7.0 (GLOVE) ×4 IMPLANT
GLOVE BIOGEL PI INDICATOR 7.0 (GLOVE) ×4
KIT ACCESSORY DA VINCI DISP (KITS) ×2
KIT ACCESSORY DVNC DISP (KITS) ×2 IMPLANT
LEGGING LITHOTOMY PAIR STRL (DRAPES) ×4 IMPLANT
LIQUID BAND (GAUZE/BANDAGES/DRESSINGS) ×4 IMPLANT
NEEDLE HYPO 22GX1.5 SAFETY (NEEDLE) ×4 IMPLANT
NEEDLE INSUFFLATION 150MM (ENDOMECHANICALS) ×4 IMPLANT
OCCLUDER COLPOPNEUMO (BALLOONS) ×2 IMPLANT
PACK ROBOT WH (CUSTOM PROCEDURE TRAY) ×4 IMPLANT
PACK ROBOTIC GOWN (GOWN DISPOSABLE) ×4 IMPLANT
PAD POSITIONER PINK NONSTERILE (MISCELLANEOUS) ×4 IMPLANT
PAD PREP 24X48 CUFFED NSTRL (MISCELLANEOUS) ×8 IMPLANT
SCISSORS LAP 5X45 EPIX DISP (ENDOMECHANICALS) ×2 IMPLANT
SET CYSTO W/LG BORE CLAMP LF (SET/KITS/TRAYS/PACK) IMPLANT
SET IRRIG TUBING LAPAROSCOPIC (IRRIGATION / IRRIGATOR) ×4 IMPLANT
SET TRI-LUMEN FLTR TB AIRSEAL (TUBING) ×2 IMPLANT
SUT VIC AB 0 CT1 27 (SUTURE) ×8
SUT VIC AB 0 CT1 27XBRD ANBCTR (SUTURE) ×4 IMPLANT
SUT VICRYL 0 UR6 27IN ABS (SUTURE) ×4 IMPLANT
SUT VICRYL RAPIDE 4/0 PS 2 (SUTURE) ×8 IMPLANT
SUT VLOC 180 0 9IN  GS21 (SUTURE)
SUT VLOC 180 0 9IN GS21 (SUTURE) IMPLANT
SYR 50ML LL SCALE MARK (SYRINGE) ×4 IMPLANT
SYR CONTROL 10ML LL (SYRINGE) ×4 IMPLANT
SYRINGE 10CC LL (SYRINGE) ×4 IMPLANT
TIP RUMI ORANGE 6.7MMX12CM (TIP) IMPLANT
TIP UTERINE 5.1X6CM LAV DISP (MISCELLANEOUS) IMPLANT
TIP UTERINE 6.7X10CM GRN DISP (MISCELLANEOUS) IMPLANT
TIP UTERINE 6.7X6CM WHT DISP (MISCELLANEOUS) IMPLANT
TIP UTERINE 6.7X8CM BLUE DISP (MISCELLANEOUS) IMPLANT
TOWEL OR 17X24 6PK STRL BLUE (TOWEL DISPOSABLE) ×12 IMPLANT
TROCAR DISP BLADELESS 8 DVNC (TROCAR) ×2 IMPLANT
TROCAR DISP BLADELESS 8MM (TROCAR) ×2
TROCAR PORT AIRSEAL 5X120 (TROCAR) ×2 IMPLANT
TROCAR XCEL 12X100 BLDLESS (ENDOMECHANICALS) ×2 IMPLANT
TROCAR XCEL NON-BLD 5MMX100MML (ENDOMECHANICALS) IMPLANT
TROCAR Z-THREAD 12X150 (TROCAR) ×4 IMPLANT
WARMER LAPAROSCOPE (MISCELLANEOUS) ×4 IMPLANT
WATER STERILE IRR 1000ML POUR (IV SOLUTION) ×12 IMPLANT

## 2014-12-13 NOTE — Transfer of Care (Signed)
Immediate Anesthesia Transfer of Care Note  Patient: Christy Hill  Procedure(s) Performed: Procedure(s) with comments: ROBOTIC ASSISTED TOTAL HYSTERECTOMY (N/A) - 2 1/2 hrs. BILATERAL SALPINGECTOMY (Bilateral)  Patient Location: PACU  Anesthesia Type:General  Level of Consciousness: awake, alert  and oriented  Airway & Oxygen Therapy: Patient Spontanous Breathing and Patient connected to nasal cannula oxygen  Post-op Assessment: Report given to RN and Post -op Vital signs reviewed and stable  Post vital signs: Reviewed and stable  Last Vitals:  Filed Vitals:   12/13/14 1135  BP: 115/72  Pulse: 60  Temp: 36.9 C  Resp: 20    Complications: No apparent anesthesia complications

## 2014-12-13 NOTE — Progress Notes (Signed)
Patient ID: Christy Hill, female   DOB: [birth year], 59 y.o.   MRN: [id number] Patient seen and examined. Consent witnessed and signed. No changes noted. Update completed.

## 2014-12-13 NOTE — Anesthesia Preprocedure Evaluation (Addendum)
Anesthesia Evaluation  Patient identified by MRN, date of birth, ID band Patient awake    Reviewed: Allergy & Precautions, NPO status , Patient's Chart, lab work & pertinent test results  History of Anesthesia Complications (+) PONV and history of anesthetic complications  Airway Mallampati: II  TM Distance: >3 FB Neck ROM: Full    Dental no notable dental hx. (+) Dental Advisory Given   Pulmonary asthma ,  breath sounds clear to auscultation  Pulmonary exam normal       Cardiovascular negative cardio ROS Normal cardiovascular examRhythm:Regular Rate:Normal     Neuro/Psych  Headaches, PSYCHIATRIC DISORDERS Anxiety Depression    GI/Hepatic negative GI ROS, Neg liver ROS,   Endo/Other  Hyperthyroidism ([place])   Renal/GU negative Renal ROS  negative genitourinary   Musculoskeletal  (+) Arthritis -, Fibromyalgia -  Abdominal   Peds negative pediatric ROS (+)  Hematology negative hematology ROS (+)   Anesthesia Other Findings   Reproductive/Obstetrics negative OB ROS                             Anesthesia Physical Anesthesia Plan  ASA: II  Anesthesia Plan: General   Post-op Pain Management:    Induction: Intravenous  Airway Management Planned: Oral ETT  Additional Equipment:   Intra-op Plan:   Post-operative Plan: Extubation in OR  Informed Consent: I have reviewed the patients History and Physical, chart, labs and discussed the procedure including the risks, benefits and alternatives for the proposed anesthesia with the patient or authorized representative who has indicated his/her understanding and acceptance.   Dental advisory given  Plan Discussed with: CRNA  Anesthesia Plan Comments:         Anesthesia Quick Evaluation

## 2014-12-13 NOTE — Anesthesia Postprocedure Evaluation (Signed)
  Anesthesia Post-op Note  Patient: Christy Hill  Procedure(s) Performed: Procedure(s) (LRB): ROBOTIC ASSISTED TOTAL HYSTERECTOMY (N/A) BILATERAL SALPINGECTOMY (Bilateral)  Patient Location: PACU  Anesthesia Type: General  Level of Consciousness: awake and alert   Airway and Oxygen Therapy: Patient Spontanous Breathing  Post-op Pain: mild  Post-op Assessment: Post-op Vital signs reviewed, Patient's Cardiovascular Status Stable, Respiratory Function Stable, Patent Airway and No signs of Nausea or vomiting  Last Vitals:  Filed Vitals:   12/13/14 1730  BP: 111/57  Pulse: 61  Temp: 36.4 C  Resp: 12    Post-op Vital Signs: stable   Complications: No apparent anesthesia complications

## 2014-12-13 NOTE — Op Note (Signed)
12/13/2014  3:41 PM  PATIENT:  Suzan Slick Saperstein  59 y.o. female  PRE-OPERATIVE DIAGNOSIS:  Menorrhagia Symptomatic fibroids  POST-OPERATIVE DIAGNOSIS:  Menorrhagia Fibroids Pelvic adhesions enterocele  PROCEDURE:  Procedure(s): ROBOTIC ASSISTED TOTAL HYSTERECTOMY BILATERAL SALPINGECTOMY LYSIS OF ADHESIONS MCCALL CUL DE PLASTY   SURGEON:  Surgeon(s): Brien Few, MD  ASSISTANTS: Mel Almond, CNM, FACM   ANESTHESIA:   local and general  ESTIMATED BLOOD LOSS: 50cc  DRAINS: Urinary Catheter (Foley)   LOCAL MEDICATIONS USED:  MARCAINE    and Amount: 20 ml  SPECIMEN:  Source of Specimen:  uterus , cervix and bilateral tubal segments  DISPOSITION OF SPECIMEN:  PATHOLOGY  COUNTS:  YES  DICTATION #: [id number]  PLAN OF CARE: dc home in am  PATIENT DISPOSITION:  PACU - hemodynamically stable.

## 2014-12-13 NOTE — Anesthesia Procedure Notes (Signed)
Procedure Name: Intubation Date/Time: 12/13/2014 1:21 PM Performed by: Mayer Camel, Emeril Stille A Pre-anesthesia Checklist: Patient identified, Emergency Drugs available, Suction available and Patient being monitored Oxygen Delivery Method: Circle system utilized Preoxygenation: Pre-oxygenation with 100% oxygen Intubation Type: IV induction Ventilation: Mask ventilation without difficulty Laryngoscope Size: Miller and 3 Grade View: Grade I Tube type: Oral Tube size: 6.0 mm Number of attempts: 1 Placement Confirmation: ETT inserted through vocal cords under direct vision,  positive ETCO2 and breath sounds checked- equal and bilateral Secured at: 20 cm Tube secured with: Tape Dental Injury: Teeth and Oropharynx as per pre-operative assessment

## 2014-12-14 ENCOUNTER — Encounter (HOSPITAL_COMMUNITY): Payer: Self-pay | Admitting: Obstetrics and Gynecology

## 2014-12-14 DIAGNOSIS — D259 Leiomyoma of uterus, unspecified: Secondary | ICD-10-CM | POA: Diagnosis not present

## 2014-12-14 LAB — CBC
HEMATOCRIT: 34 % — AB (ref 36.0–46.0)
Hemoglobin: 11.7 g/dL — ABNORMAL LOW (ref 12.0–15.0)
MCH: 31.8 pg (ref 26.0–34.0)
MCHC: 34.4 g/dL (ref 30.0–36.0)
MCV: 92.4 fL (ref 78.0–100.0)
Platelets: 197 10*3/uL (ref 150–400)
RBC: 3.68 MIL/uL — ABNORMAL LOW (ref 3.87–5.11)
RDW: 12.6 % (ref 11.5–15.5)
WBC: 10.5 10*3/uL (ref 4.0–10.5)

## 2014-12-14 LAB — BASIC METABOLIC PANEL
Anion gap: 5 (ref 5–15)
BUN: 12 mg/dL (ref 6–20)
CALCIUM: 8.2 mg/dL — AB (ref 8.9–10.3)
CHLORIDE: 105 mmol/L (ref 101–111)
CO2: 24 mmol/L (ref 22–32)
Creatinine, Ser: 0.75 mg/dL (ref 0.44–1.00)
GFR calc Af Amer: >60 mL/min (ref >60)
GFR calc non Af Amer: >60 mL/min (ref >60)
Glucose, Bld: 185 mg/dL — ABNORMAL HIGH (ref 70–99)
Potassium: 4.4 mmol/L (ref 3.5–5.1)
SODIUM: 134 mmol/L — AB (ref 135–145)

## 2014-12-14 MED ORDER — TRAMADOL HCL 50 MG PO TABS: 50.0000 mg | ORAL_TABLET | Freq: Four times a day (QID) | ORAL | Status: DC | PRN

## 2014-12-14 MED ORDER — OXYCODONE-ACETAMINOPHEN 5-325 MG PO TABS: 1.0000 | ORAL_TABLET | ORAL | Status: DC | PRN

## 2014-12-14 NOTE — Addendum Note (Signed)
Addendum  created 12/14/14 0900 by Hewitt Blade, CRNA   Modules edited: Notes Section   Notes Section:  File: [id number]

## 2014-12-14 NOTE — Progress Notes (Signed)
1 Day Post-Op Procedure(s) (LRB): ROBOTIC ASSISTED TOTAL HYSTERECTOMY (N/A) BILATERAL SALPINGECTOMY (Bilateral)  Subjective: Patient reports nausea, incisional pain, tolerating PO, + flatus and no problems voiding.    Objective: BP 131/68 mmHg  Pulse 94  Temp(Src) 98.8 F (37.1 C) (Oral)  Resp 18  Ht 5\' 7"  (1.702 m)  Wt 64.411 kg (142 lb)  BMI 22.24 kg/m2  SpO2 98%  I have reviewed patient's vital signs, intake and output, medications and labs. CBC    Component Value Date/Time   WBC 10.5 12/14/2014 0536   RBC 3.68* 12/14/2014 0536   HGB 11.7* 12/14/2014 0536   HCT 34.0* 12/14/2014 0536   PLT 197 12/14/2014 0536   MCV 92.4 12/14/2014 0536   MCH 31.8 12/14/2014 0536   MCHC 34.4 12/14/2014 0536   RDW 12.6 12/14/2014 0536   LYMPHSABS 1.6 09/14/2013 0912   MONOABS 0.4 09/14/2013 0912   EOSABS 0.1 09/14/2013 0912   BASOSABS 0.0 09/14/2013 0912      General: alert, cooperative and appears stated age Resp: clear to auscultation bilaterally and normal percussion bilaterally Cardio: regular rate and rhythm, S1, S2 normal, no murmur, click, rub or gallop GI: soft, non-tender; bowel sounds normal; no masses,  no organomegaly, normal findings: bowel sounds normal and soft, non-tender and incision: clean, dry and intact Extremities: extremities normal, atraumatic, no cyanosis or edema, Homans sign is negative, no sign of DVT and no edema, redness or tenderness in the calves or thighs Vaginal Bleeding: minimal  Assessment: s/p Procedure(s) with comments: ROBOTIC ASSISTED TOTAL HYSTERECTOMY (N/A) - 2 1/2 hrs. BILATERAL SALPINGECTOMY (Bilateral): stable, progressing well and tolerating diet  Plan: Advance diet Encourage ambulation Advance to PO medication Discontinue IV fluids Clear liquids  DC home Tramadol and Oxycodone FU office 2 weeks      Gagandeep Pettet J 12/14/2014, 7:15 AM

## 2014-12-14 NOTE — Anesthesia Postprocedure Evaluation (Signed)
  Anesthesia Post-op Note  Patient: Christy Hill  Procedure(s) Performed: Procedure(s) with comments: ROBOTIC ASSISTED TOTAL HYSTERECTOMY (N/A) - 2 1/2 hrs. BILATERAL SALPINGECTOMY (Bilateral)  Patient Location: PACU and Women's Unit  Anesthesia Type:General  Level of Consciousness: awake, alert  and oriented  Airway and Oxygen Therapy: Patient Spontanous Breathing  Post-op Pain: none  Post-op Assessment: Post-op Vital signs reviewed and Patient's Cardiovascular Status Stable  Post-op Vital Signs: Reviewed and stable  Last Vitals:  Filed Vitals:   12/14/14 0547  BP: 131/68  Pulse: 94  Temp: 37.1 C  Resp: 18    Complications: No apparent anesthesia complications

## 2014-12-14 NOTE — Op Note (Signed)
Christy Hill, Christy Hill                 ACCOUNT NO.:  [id number]  MEDICAL RECORD NO.:  [id number]  LOCATION:  7253                          FACILITY:  [facility]  PHYSICIAN:  Lovenia Kim, M.D.DATE OF BIRTH:  Nov 08, 1955  DATE OF PROCEDURE: DATE OF DISCHARGE:                              OPERATIVE REPORT   PREOPERATIVE DIAGNOSIS:  Symptomatic fibroids with menometrorrhagia. Normal endometrial biopsy.  POSTOPERATIVE DIAGNOSES:  Symptomatic fibroids with menometrorrhagia. Normal endometrial biopsy.  Pelvic adhesions.  Left pelvic and omental adhesions.  Enterocele.  PROCEDURES:  Da Vinci assisted total laparoscopic hysterectomy, bilateral salpingectomy, lysis of adhesions, McCall culdoplasty.  SURGEON:  Lovenia Kim, M.D.  ASSISTANT:  Mel Almond.  ESTIMATED BLOOD LOSS:  Less than 50 mL.  COMPLICATIONS:  None.  DRAINS:  Foley.  COUNTS:  Correct.  DISPOSITION:  The patient to recovery in good condition.  SPECIMEN:  Uterus with cervix and bilateral tubal segments.  DESCRIPTION OF PROCEDURE:  After being apprised of risks of anesthesia, infection, bleeding, injury to surrounding organs, possible need for repair, delayed versus immediate complications to include bowel and bladder injury, possible need for repair, the patient was brought to the operating room where she was administered general anesthetic without complications.  Prepped and draped in usual sterile fashion.  Foley catheter placed.  RUMI retractor placed vaginally.  Attention was turned to the uterus about 12-week size by bimanual exam.  Attention was turned to the abdominal portion of the procedure whereby a supraumbilical incision was made with a scalpel.  Veress needle placed, opening pressure -3.  3 L of CO2 insufflated without difficulty.  Trocar was placed atraumatically.  Visualization revealed omental adhesions to the anterior abdominal wall.  Two 8 mm ports were placed on the right, 8 and 5 on the left.   AirSeal was established.  Endo Shears entered through the mid supra umbilical port and the adhesions were lysed sharply with Endo Shears without difficulty.  Attention was turned to the large fibroid uterus which was elevated out of the pelvis.  The ureter was identified on the left side.  Mesosalpinx cauterized and cut.  The left tubal segment was removed.  The retroperitoneal space was entered.  The ureter was identified and dissected off the medial leaf of the peritoneum.  The posterior leaf of the broad ligament was developed. The tuboovarian ligament was cauterized with bipolar cautery and cut. The round ligament was divided.  The bowel adhesions on the left have been sharply lysed before exposing and otherwise normal left adnexa. Using sharp adhesiolysis, anteriorly bladder flap adhesions were lysed. The uterus was retroverted and bladder flap was developed sharply.  The uterine vessels were skeletonized on the left, cauterized with bipolar cautery, but not cut.  On the right, mesosalpinx cauterized and cut, tubal segment removed.  Retroperitoneal space entered.  Ureter dissected off the medial leaf of the peritoneum.  Posterior leaf of the broad ligament developed.  Round ligament divided.  Tubo-ovarian ligament cauterized and cut.  Uterine vessels on the right skeletonized.  Bladder flap further developed sharply.  Uterine vessels on the right cauterized in a 3-point method using bipolar cautery and cut.  On the left side, vessels  were then cut.  The RUMI cup was identified circumferentially 360 degrees and scored along its cephalad portion, exposing the RUMI cup and the specimen was then detached, bivalved, and removed vaginally. The vaginal cuff was closed using 0 V-Loc suture in a continuous running fashion.  Second imbricating layer was placed and enterocele was closed using McCall culdoplasty suture.  Good hemostasis was noted.  Irrigation was accomplished.  All instruments  were removed from the abdomen. Ropivacaine was placed.  CO2 was released.  Incisions were closed using 0 Vicryl, 4-0 Vicryl, and Dermabond.  Vaginal exam reveals a well- approximated vaginal mucosa.  No evidence of dehiscence.  Urine was clear.  Please note that the ureters were noted to be peristalsing normally prior to the end of the procedure.  Incisions had been closed as noted.  The patient tolerated the procedure well, was awakened, and transferred to recovery in good condition.     Lovenia Kim, M.D.     RJT/MEDQ  D:  12/13/2014  T:  12/14/2014  Job:  [id number]

## 2014-12-14 NOTE — Progress Notes (Signed)
Pt is discharged in the care of [place] R.N. [facility]. Denies any pain or discomfort. Spirits are good.  Denies any pain or discomfort. Discharged instructions with Rx were given to pt. With good understanding. Questions asked and answered.No equipment needed for home use.Marland Kitchen

## 2015-02-03 ENCOUNTER — Other Ambulatory Visit: Payer: Self-pay

## 2015-02-03 DIAGNOSIS — Z1231 Encounter for screening mammogram for malignant neoplasm of breast: Secondary | ICD-10-CM

## 2015-03-08 ENCOUNTER — Ambulatory Visit
Admission: RE | Admit: 2015-03-08 | Discharge: 2015-03-08 | Disposition: A | Discharge status: Home / Self Care | Payer: 59 | Source: Ambulatory Visit

## 2015-03-08 DIAGNOSIS — Z1231 Encounter for screening mammogram for malignant neoplasm of breast: Secondary | ICD-10-CM

## 2015-03-16 ENCOUNTER — Telehealth: Payer: Self-pay | Admitting: *Deleted

## 2015-03-16 NOTE — Telephone Encounter (Signed)
08 Pap recall due 12/2014 due to ASCUS on previous pap Recall Comments:  08 -AEX W/ ABN PAP HX, due for 3 year pap/sy/[place]  Past History:   12/06/11 Pap, ASCUS with neg HR HPV No previous history of abnormal pap  Pt has not been seen in our office since 12/06/11 AEX with French Ana, CNM.  Pt cancelled 12/10/12 AEX, but did not give reason.  Pt had hysterectomy on 12/13/14, which is documented in EPIC, with a physician from another practice.  Please advise recall.

## 2015-03-16 NOTE — Telephone Encounter (Signed)
Reviewed pathology from hysterectomy.  Pt has been seen at two different practices since 2013 visit.  Ok to remove from recall.

## 2015-03-17 NOTE — Telephone Encounter (Signed)
Pt removed from current recall.   Closing encounter.

## 2015-12-19 ENCOUNTER — Telehealth: Payer: Self-pay | Admitting: *Deleted

## 2015-12-19 NOTE — Telephone Encounter (Signed)
Pt is at work and unable to participate in pre-visit planning at time of call.

## 2015-12-20 ENCOUNTER — Encounter: Payer: Self-pay | Admitting: Family Medicine

## 2015-12-20 ENCOUNTER — Ambulatory Visit (INDEPENDENT_AMBULATORY_CARE_PROVIDER_SITE_OTHER): Payer: 59 | Admitting: Family Medicine

## 2015-12-20 VITALS — BP 97/64 | HR 69 | Temp 98.7°F | Ht 67.0 in | Wt 136.6 lb

## 2015-12-20 DIAGNOSIS — E063 Autoimmune thyroiditis: Secondary | ICD-10-CM

## 2015-12-20 DIAGNOSIS — Z23 Encounter for immunization: Secondary | ICD-10-CM | POA: Diagnosis not present

## 2015-12-20 DIAGNOSIS — Z7189 Other specified counseling: Secondary | ICD-10-CM

## 2015-12-20 DIAGNOSIS — Z7689 Persons encountering health services in other specified circumstances: Secondary | ICD-10-CM

## 2015-12-20 NOTE — Progress Notes (Signed)
Pre visit review using our clinic review tool, if applicable. No additional management support is needed unless otherwise documented below in the visit note. 

## 2015-12-20 NOTE — Progress Notes (Signed)
[place] at [facility] [address] [place], [place] [address] [phone]  Date:  12/20/2015   Name:  Christy Hill   DOB:  [birth year]   MRN:  [id number]  PCP:  Lamar Blinks, MD    Chief Complaint: Establish Care   History of Present Illness:  Christy Hill is a 60 y.o. very pleasant female patient who presents with the following:  Here today to establish care.  She does have rare, intermittent atrial fibrillation- she is on aspirin 3x a week.  She has Graves disease and hishimotos thyroiditis.   Her thyroid and other hormonal concerns are managed by [facility].   History of hysterectomy- she had fibroids that did not respond to ablation. Never had any GYN cancers.  mammo and pap are UTD  She works with tax filing- individal taxes mostly.  She is from [place] originally but has lived in the area for about 20 years  She has 3 kids- twins who are 22 and 60 yo.  All live in the area. She has 4 grandchildren. She is married.   Due for a tetanus today but she declines She has had extensive labs per [facility] not long ago- no labs needed today She exercises regularly with yoga and pilates  Patient Active Problem List   Diagnosis Date Noted  . Dysmenorrhea 12/13/2014  . Graves' ophthalmopathy 09/14/2013  . Synovitis of finger 09/14/2013  . Thyroid eye disease 04/05/2013  . Vaginal spotting 12/10/2012  . Hyperthyroidism 06/22/2012  . History of fibromyalgia 06/21/2012  . Allergic rhinitis 06/21/2012  . Menopause 06/11/2012  . History of migraine headaches 06/11/2012  . Depression 06/11/2012  . S/P cholecystectomy 06/11/2012  . S/P exploratory laparotomy 06/11/2012  . New onset atrial fibrillation ([place]) 06/11/2012    Past Medical History  Diagnosis Date  . Thyroid disease   . Atrial fibrillation ([place])     secondary to thyroid  . Hyperthyroidism   . Asthma     no meds, years no problems  . Migraines      history  . Fibromyalgia   . Anemia     currently taking iron  . PONV (postoperative nausea and vomiting)     after all surgeries including colonoscopy  . Adverse effect of general anesthetic     pt has graves disase and needs eye ointment to keeps lubricated    Past Surgical History  Procedure Laterality Date  . Cesarean section    . Appendectomy    . Cholecystectomy    . Endometrial ablation    . Knee arthroscopy    . Breast implant removal    . Breast enhancement surgery    . Bunionectomy    . Colonoscopy    . Dilatation & currettage/hysteroscopy with resectocope N/A 03/31/2013    Procedure: DILATATION & CURETTAGE/HYSTEROSCOPY WITH RESECTOCOPE;  Surgeon: Linda Hedges, DO;  Location: [facility];  Service: Gynecology;  Laterality: N/A;  . Robotic assisted total hysterectomy N/A 12/13/2014    Procedure: ROBOTIC ASSISTED TOTAL HYSTERECTOMY;  Surgeon: Brien Few, MD;  Location: [facility];  Service: Gynecology;  Laterality: N/A;  2 1/2 hrs.  . Bilateral salpingectomy Bilateral 12/13/2014    Procedure: BILATERAL SALPINGECTOMY;  Surgeon: Brien Few, MD;  Location: [facility];  Service: Gynecology;  Laterality: Bilateral;    Social History  Substance Use Topics  . Smoking status: Never Smoker   . Smokeless tobacco: Never Used  . Alcohol Use: 0.6 - 1.2  oz/week    1-2 Standard drinks or equivalent per week     Comment: HAD ETOH ON TUESDAY    Family History  Problem Relation Age of Onset  . Depression Mother   . Dementia Mother   . Cancer Father   . Dementia Father   . Depression Sister   . Depression Brother   . Pernicious anemia Paternal Aunt   . Pernicious anemia Paternal Grandmother   . Alcohol abuse Neg Hx   . Bipolar disorder Son     Allergies  Allergen Reactions  . Gluten Meal   . Eggs Or Egg-Derived Products Rash  . Tomato Rash    Medication list has been reviewed and updated.  Current Outpatient Prescriptions on File Prior to Visit  Medication Sig Dispense  Refill  . Acetylcysteine (N-ACETYL-L-CYSTEINE PO) Take 500 mg by mouth 2 (two) times daily.    . Ascorbic Acid (VITAMIN C) 1000 MG tablet Take 1,000 mg by mouth 3 (three) times daily.    . ASHWAGANDHA PO Take 300 mg by mouth daily.    Marland Kitchen b complex vitamins tablet Take 1 tablet by mouth daily.    . Cholecalciferol (VITAMIN D3) 5000 UNITS CAPS Take 1 capsule by mouth daily.    . Creatine 5000 MG PACK Take 1 scoop by mouth daily.    Marland Kitchen ibuprofen (ADVIL) 800 MG tablet Take 1 tablet (800 mg total) by mouth every 6 (six) hours as needed for pain. 30 tablet 0  . Iodine Strong, Lugols, (IODINE STRONG PO) Take 1,000 mcg by mouth daily.    . IRON PO Take 25 mg by mouth daily.    . Multiple Vitamins-Minerals (ZINC PO) Take 1 tablet by mouth daily. With copper    . NALTREXONE HCL PO Take 4.5 mg by mouth daily. For auto immune issues    . Nutritional Supplements (DHEA PO) Take 100 mg by mouth daily.    . Omega-3 Fatty Acids (FISH OIL) 1000 MG CAPS Take 1 capsule by mouth 3 (three) times daily.    Marland Kitchen oxyCODONE-acetaminophen (PERCOCET/ROXICET) 5-325 MG per tablet Take 1-2 tablets by mouth every 4 (four) hours as needed for severe pain. 30 tablet 0  . Pregnenolone POWD Take 1 capsule by mouth daily.    . Probiotic Product (PROBIOTIC PO) Take 1 capsule by mouth daily.    . progesterone (PROMETRIUM) 100 MG capsule Take 400 mg by mouth at bedtime.    Marland Kitchen scopolamine (TRANSDERM-SCOP) 1 MG/3DAYS Place 1 patch (1.5 mg total) onto the skin every 3 (three) days. 3 patch 0  . Selenium 200 MCG CAPS Take 200 mcg by mouth daily.    . Testosterone 10 MG/ACT (2%) GEL Place 0.25-0.5 mLs onto the skin at bedtime. Apply behind the knee    . traMADol (ULTRAM) 50 MG tablet Take 1 tablet (50 mg total) by mouth every 6 (six) hours as needed for moderate pain. 30 tablet 0  . VITAMIN K PO Take 900 mcg by mouth daily.    Marland Kitchen zolpidem (AMBIEN) 5 MG tablet Take 1 tablet (5 mg total) by mouth at bedtime as needed for sleep. 15 tablet 1   No  current facility-administered medications on file prior to visit.    Review of Systems:  As per HPI- otherwise negative.   Physical Examination: Filed Vitals:   12/20/15 1000  BP: 97/64  Pulse: 69  Temp: 98.7 F (37.1 C)    There were no vitals filed for this visit. Ideal Body Weight:  GEN: WDWN, NAD, Non-toxic, A & O x 3, looks well HEENT: Atraumatic, Normocephalic. Neck supple. No masses, No LAD.  Bilateral TM wnl, oropharynx normal.  PEERL,EOMI.   Ears and Nose: No external deformity. CV: RRR, No M/G/R. No JVD. No thrill. No extra heart sounds. PULM: CTA B, no wheezes, crackles, rhonchi. No retractions. No resp. distress. No accessory muscle use. ABD: S, NT, ND. No rebound. No HSM. EXTR: No c/c/e NEURO Normal gait.  PSYCH: Normally interactive. Conversant. Not depressed or anxious appearing.  Calm demeanor.    Assessment and Plan: Immunization due  Hashimoto's thyroiditis  Establishing care with new doctor, encounter for  Here today to establish care.  Declines a tetanus booster today- asked her to be sure and get this if she has any sort of wound Her thyroid is managed by [facility] She will plan to see me for a CPE in a few months   Signed Lamar Blinks, MD

## 2015-12-20 NOTE — Patient Instructions (Signed)
It was nice to meet you today I would recommend that you have a bone density in the next couple of years Also you are due for a tetanus shot- if you have any sort of significant wound be sure to get this done, otherwise we can do at your next visit

## 2016-05-27 ENCOUNTER — Encounter: Payer: Self-pay | Admitting: Family Medicine

## 2016-05-27 NOTE — Telephone Encounter (Signed)
Called patient offered appointment, patient states she found an appointment somewhere else.  Apologize for the inconvenience and informed patient when calling office to dial 0 to bypass greeting patient expressed no option was given. In addition advised patient about MyChart, patient expressed theres no option to schedule, offered guidance regarding how to schedule thru mychart patient declined.

## 2016-06-04 ENCOUNTER — Encounter: Payer: Self-pay | Admitting: Physician Assistant

## 2016-06-04 ENCOUNTER — Other Ambulatory Visit: Payer: Self-pay | Admitting: Physician Assistant

## 2016-06-04 ENCOUNTER — Ambulatory Visit (INDEPENDENT_AMBULATORY_CARE_PROVIDER_SITE_OTHER): Payer: 59 | Admitting: Physician Assistant

## 2016-06-04 VITALS — BP 100/62 | HR 50 | Temp 98.2°F | Resp 16 | Ht 67.0 in | Wt 139.2 lb

## 2016-06-04 DIAGNOSIS — L309 Dermatitis, unspecified: Secondary | ICD-10-CM | POA: Diagnosis not present

## 2016-06-04 LAB — HIGH SENSITIVITY CRP: CRP, High Sensitivity: 0.22 mg/L (ref 0.000–5.000)

## 2016-06-04 MED ORDER — METHYLPREDNISOLONE ACETATE 40 MG/ML IJ SUSP: 40.0000 mg | Freq: Once | INTRAMUSCULAR | Status: DC

## 2016-06-04 MED ORDER — PREDNISONE 10 MG PO TABS: ORAL_TABLET | ORAL | 0 refills | Status: DC

## 2016-06-04 NOTE — Progress Notes (Signed)
Pre visit review using our clinic review tool, if applicable. No additional management support is needed unless otherwise documented below in the visit note/SLS  

## 2016-06-04 NOTE — Progress Notes (Signed)
Patient presents to clinic today c/o pruritic rash x 2 weeks that initially was mild and starting on chest. Has since worsened and spread to flexural surfaces of arms bilaterally and some new rash noted on face. Rash is pruritic but non-painful. Denies fever, chills. Has chronic Lyme Disease, followed by ID. Denies change to soaps or lotions. Denies change to detergent, makeup or other hygiene products. Is on multiple supplements by ID for her chronic Lyme. Stopped supplements in the past 4 days as she was worried they may be contributing.   Past Medical History:  Diagnosis Date  . Adverse effect of general anesthetic    pt has graves disase and needs eye ointment to keeps lubricated  . Anemia    currently taking iron  . Asthma    no meds, years no problems  . Atrial fibrillation ([place])    secondary to thyroid  . Fibromyalgia   . Hyperthyroidism   . Migraines    history  . PONV (postoperative nausea and vomiting)    after all surgeries including colonoscopy  . Thyroid disease     Current Outpatient Prescriptions on File Prior to Visit  Medication Sig Dispense Refill  . Ascorbic Acid (VITAMIN C) 1000 MG tablet Take 1,000 mg by mouth 3 (three) times daily.    . ASHWAGANDHA PO Take 300 mg by mouth daily.    Marland Kitchen b complex vitamins tablet Take 1 tablet by mouth daily.    . Cholecalciferol (VITAMIN D3) 5000 UNITS CAPS Take 1 capsule by mouth daily.    . Creatine 5000 MG PACK Take 1 scoop by mouth daily.    . Iodine Strong, Lugols, (IODINE STRONG PO) Take 1,000 mcg by mouth daily.    . Multiple Vitamins-Minerals (ZINC PO) Take 1 tablet by mouth daily. With copper    . NALTREXONE HCL PO Take 4.5 mg by mouth daily. For auto immune issues    . Nutritional Supplements (DHEA PO) Take 100 mg by mouth daily.    . Omega-3 Fatty Acids (FISH OIL) 1000 MG CAPS Take 1 capsule by mouth 3 (three) times daily.    . Pregnenolone POWD Take 1 capsule by mouth daily.    . Probiotic Product (PROBIOTIC PO)  Take 1 capsule by mouth daily.    . progesterone (PROMETRIUM) 100 MG capsule Take 400 mg by mouth at bedtime.    . Selenium 200 MCG CAPS Take 200 mcg by mouth daily.    . Testosterone 10 MG/ACT (2%) GEL Place 0.25-0.5 mLs onto the skin at bedtime. Apply behind the knee    . VITAMIN K PO Take 900 mcg by mouth daily.     No current facility-administered medications on file prior to visit.     Allergies  Allergen Reactions  . Influenza Vaccines Other (See Comments)    AFIB  . Gluten Meal   . Lac Bovis Nausea Only    Cow milk only  . Molds & Smuts Other (See Comments)    Sick to stomach - headache  . Pollen Extract Other (See Comments)    sneezing  . Wheat Bran Other (See Comments)  . Eggs Or Egg-Derived Products Rash  . Tomato Rash    Family History  Problem Relation Age of Onset  . Depression Mother   . Dementia Mother   . Cancer Father   . Dementia Father   . Depression Sister   . Depression Brother   . Pernicious anemia Paternal Aunt   . Pernicious anemia Paternal  Grandmother   . Alcohol abuse Neg Hx   . Bipolar disorder Son     Social History   Social History  . Marital status: Married    Spouse name: N/A  . Number of children: N/A  . Years of education: N/A   Occupational History  . Enrolled Agent    Social History Main Topics  . Smoking status: Never Smoker  . Smokeless tobacco: Never Used  . Alcohol use 0.6 - 1.2 oz/week    1 - 2 Standard drinks or equivalent per week     Comment: HAD ETOH ON TUESDAY  . Drug use: No  . Sexual activity: Yes   Other Topics Concern  . None   Social History Narrative  . None    Review of Systems - See HPI.  All other ROS are negative.  BP 100/62 (BP Location: Right Arm, Patient Position: Sitting, Cuff Size: Normal)   Pulse (!) 50   Temp 98.2 F (36.8 C) (Oral)   Resp 16   Ht 5\' 7"  (1.702 m)   Wt 139 lb 4 oz (63.2 kg)   LMP 08/12/2003   SpO2 98%   BMI 21.81 kg/m   Physical Exam  Constitutional: She is  oriented to person, place, and time and well-developed, well-nourished, and in no distress.  HENT:  Head: Normocephalic and atraumatic.  Eyes: Conjunctivae are normal.  Neck: Neck supple.  Cardiovascular: Normal rate, regular rhythm, normal heart sounds and intact distal pulses.   Pulmonary/Chest: Breath sounds normal. No respiratory distress. She has no wheezes. She has no rales. She exhibits no tenderness.  Neurological: She is alert and oriented to person, place, and time.  Skin: Skin is warm and dry. Rash noted. Rash is maculopapular.     Psychiatric: Affect normal.  Vitals reviewed.   No results found for this or any previous visit (from the past 2160 hour(s)).  Assessment/Plan: 1. Dermatitis Unspecified. Suspect allergic dermatitis. She is to restart supplements one at a time once resolved. Hygiene products to be reintroduced one at a time as well. Rx prednisone taper to resolve rash. Supportive measures and OTC medications reviewed. Will check CRP today as patient concerned for AI cause of symptoms.  - predniSONE (DELTASONE) 10 MG tablet; Take 4 tablets x 3 days, then 3 tablets x 3 days, then 2 tablets x 3 days, then 1 tablet x 3 days.  Dispense: 30 tablet; Refill: 0 - CRP High sensitivity   Leeanne Rio, [place]

## 2016-06-04 NOTE — Patient Instructions (Signed)
Please take the steroid as directed. Cool compresses and [employer]	[place] can help with itch. A Claritin or Benadryl at night will also help with itch. Avoid direct sunlight when possible.   Please go to the lab for blood work. I will call you with your results.  Follow-up if not resolving.

## 2016-06-04 NOTE — Addendum Note (Signed)
Addended by: Rockwell Germany on: 06/04/2016 04:49 PM   Modules accepted: Orders

## 2017-05-13 ENCOUNTER — Encounter: Payer: Self-pay | Admitting: Family Medicine

## 2017-05-13 DIAGNOSIS — R9389 Abnormal findings on diagnostic imaging of other specified body structures: Secondary | ICD-10-CM

## 2017-05-14 NOTE — Telephone Encounter (Signed)
Patient called this morning to inquire about this email. Please call and advise patient.

## 2017-05-16 ENCOUNTER — Ambulatory Visit (HOSPITAL_BASED_OUTPATIENT_CLINIC_OR_DEPARTMENT_OTHER)
Admission: RE | Admit: 2017-05-16 | Discharge: 2017-05-16 | Disposition: A | Discharge status: Home / Self Care | Payer: BLUE CROSS/BLUE SHIELD | Source: Ambulatory Visit | Attending: Family Medicine | Admitting: Family Medicine

## 2017-05-16 DIAGNOSIS — R9389 Abnormal findings on diagnostic imaging of other specified body structures: Secondary | ICD-10-CM | POA: Insufficient documentation

## 2017-05-18 ENCOUNTER — Other Ambulatory Visit: Payer: Self-pay | Admitting: Family Medicine

## 2017-05-18 ENCOUNTER — Encounter: Payer: Self-pay | Admitting: Family Medicine

## 2017-05-18 DIAGNOSIS — E041 Nontoxic single thyroid nodule: Secondary | ICD-10-CM

## 2017-05-18 NOTE — Progress Notes (Signed)
Received thyroid US report, reassuring as below.  Message to pt- will need a follow-up US in 1 year. However will also have her see an endocrinologist in case any of these nodules are producing hormone   IMPRESSION: Stable to slightly smaller as thyroid isthmus nodule with previous negative cytology. The 2 largest nodules in the right lobe meet criteria for 1 year follow-up ultrasound. 0.5 cm nodule in the left lobe has developed macroscopic calcification in the interval since 2015. This likely represents dystrophic benign calcification as the lesion has not changed in size since 2013. This can be additionally followed with the 1 year follow-up ultrasound  Lab Results  Component Value Date   TSH <0.008 (L) 06/16/2012

## 2017-05-29 ENCOUNTER — Ambulatory Visit: Payer: BLUE CROSS/BLUE SHIELD | Admitting: Endocrinology

## 2018-08-29 NOTE — Progress Notes (Addendum)
[place] at [facility] [address] [place], [place] [address] [phone]  Date:  08/31/2018   Name:  Christy Hill   DOB:  04-24-1956   MRN:  [id number]  PCP:  Darreld Mclean, MD    Chief Complaint: Annual Exam (pap? has had hysterectomy, pelvic floor)   History of Present Illness:  Christy Hill is a 63 y.o. very pleasant female patient who presents with the following:  Here today for a physical History of atrial fibrillation, hyperthyroidism and Graves' ophthalmopathy, depression She does take flecainide but is not on anticoagulation She had a total thyroidectomy in 12/18- she did not have cancer as it turned out  I have actually just seen her once, in May 2017 to establish care.  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  She has 3 adult children and 4 grandchildren At that time her thyroid and other hormones were being managed by [facility] Her atrial fibrillation was quite intermittent.  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  Patient Active Problem List   Diagnosis Date Noted  . Dysmenorrhea 12/13/2014  .  Graves' ophthalmopathy 09/14/2013  . Synovitis of finger 09/14/2013  . Thyroid eye disease 04/05/2013  . Vaginal spotting 12/10/2012  . Hyperthyroidism 06/22/2012  . History of fibromyalgia 06/21/2012  . Allergic rhinitis 06/21/2012  . Menopause 06/11/2012  . History of migraine headaches 06/11/2012  . Depression 06/11/2012  . S/P cholecystectomy 06/11/2012  . S/P exploratory laparotomy 06/11/2012  . New onset atrial fibrillation ([place]) 06/11/2012    Past Medical History:  Diagnosis Date  . Adverse effect of general anesthetic    pt has graves disase and needs eye ointment to keeps lubricated  . Anemia    currently taking iron  . Asthma    no meds, years no problems  . Atrial fibrillation ([place])    secondary to thyroid  . Fibromyalgia   . Hyperthyroidism   . Migraines    history  . PONV (postoperative nausea and vomiting)    after all surgeries including colonoscopy  . Thyroid disease     Past Surgical History:  Procedure Laterality Date  . APPENDECTOMY    . BILATERAL SALPINGECTOMY Bilateral 12/13/2014   Procedure: BILATERAL SALPINGECTOMY;  Surgeon: Brien Few, MD;  Location: [facility];  Service: Gynecology;  Laterality: Bilateral;  . BREAST ENHANCEMENT SURGERY    . BREAST IMPLANT REMOVAL    . BUNIONECTOMY    . CESAREAN SECTION    . CHOLECYSTECTOMY    . COLONOSCOPY    . DILATATION & CURRETTAGE/HYSTEROSCOPY WITH RESECTOCOPE N/A  03/31/2013   Procedure: DILATATION & CURETTAGE/HYSTEROSCOPY WITH RESECTOCOPE;  Surgeon: Linda Hedges, DO;  Location: [facility];  Service: Gynecology;  Laterality: N/A;  . ENDOMETRIAL ABLATION    . KNEE ARTHROSCOPY    . ROBOTIC ASSISTED TOTAL HYSTERECTOMY N/A 12/13/2014   Procedure: ROBOTIC ASSISTED TOTAL HYSTERECTOMY;  Surgeon: Brien Few, MD;  Location: [facility];  Service: Gynecology;  Laterality: N/A;  2 1/2 hrs.    Social History   Tobacco Use  . Smoking status: Never Smoker  . Smokeless tobacco: Never Used  Substance Use Topics  .  Alcohol use: Yes    Alcohol/week: 1.0 - 2.0 standard drinks    Types: 1 - 2 Standard drinks or equivalent per week    Comment: HAD ETOH ON TUESDAY  . Drug use: No    Family History  Problem Relation Age of Onset  . Depression Mother   . Dementia Mother   . Cancer Father   . Dementia Father   . Depression Sister   . Depression Brother   . Pernicious anemia Paternal Aunt   . Pernicious anemia Paternal Grandmother   . Alcohol abuse Neg Hx   . Bipolar disorder Son     Allergies  Allergen Reactions  . Influenza Vaccines Other (See Comments)    AFIB  . Gluten Meal   . Lac Bovis Nausea Only    Cow milk only  . Molds & Smuts Other (See Comments)    Sick to stomach - headache  . Pollen Extract Other (See Comments)    sneezing  . Wheat Bran Other (See Comments)    Medication list has been reviewed and updated.  Current Outpatient Medications on File Prior to Visit  Medication Sig Dispense Refill  . Ascorbic Acid (VITAMIN C) 1000 MG tablet Take 1,000 mg by mouth 3 (three) times daily.    . ASHWAGANDHA PO Take 300 mg by mouth daily.    Marland Kitchen b complex vitamins tablet Take 1 tablet by mouth daily.    . Cholecalciferol (VITAMIN D3) 5000 UNITS CAPS Take 1 capsule by mouth daily.    Marland Kitchen estradiol (CLIMARA - DOSED IN MG/24 HR) 0.05 mg/24hr patch Place 0.05 mg onto the skin once a week.    . Multiple Vitamins-Minerals (ZINC PO) Take 1 tablet by mouth daily. With copper    . NALTREXONE HCL PO Take 4.5 mg by mouth daily. For auto immune issues    . Nutritional Supplements (DHEA PO) Take 100 mg by mouth daily.    . Omega-3 Fatty Acids (FISH OIL) 1000 MG CAPS Take 1 capsule by mouth 3 (three) times daily.    . Probiotic Product (PROBIOTIC PO) Take 1 capsule by mouth daily.    . progesterone (PROMETRIUM) 100 MG capsule Take 400 mg by mouth at bedtime.    . Selenium 200 MCG CAPS Take 200 mcg by mouth daily.    . Testosterone 10 MG/ACT (2%) GEL Place 0.25-0.5 mLs onto the skin at bedtime. Apply  behind the knee    . VITAMIN K PO Take 900 mcg by mouth daily.     No current facility-administered medications on file prior to visit.     Review of Systems:  As per HPI- otherwise negative.   Physical Examination: Vitals:   08/31/18 1121  BP: 110/62  Pulse: 64  Resp: 16  Temp: 98.6 F (37 C)  SpO2: 96%   Vitals:   08/31/18 1121  Weight: 135 lb (61.2 kg)  Height: 5\' 7"  (1.702 m)  Body mass index is 21.14 kg/m. Ideal Body Weight: Weight in (lb) to have BMI = 25: 159.3  GEN: WDWN, NAD, Non-toxic, A & O x 3, slim build, looks well and younger than age 42: Atraumatic, Normocephalic. Neck supple. No masses, No LAD.  Bilateral TM wnl, oropharynx normal.  PEERL,EOMI.   Ears and Nose: No external deformity. CV: RRR, No M/G/R. No JVD. No thrill. No extra heart sounds. PULM: CTA B, no wheezes, crackles, rhonchi. No retractions. No resp. distress. No accessory muscle use. ABD: S, NT, ND, +BS. No rebound. No HSM. EXTR: No c/c/e NEURO Normal gait.  PSYCH: Normally interactive. Conversant. Not depressed or anxious appearing.  Calm demeanor.  Breast: normal exam with implants, no masses/ dimpling/ discharge Pelvic: normal, no vaginal lesions or discharge. uterus and cervix are surgically absent. no adnexal tendereness or masses   Assessment and Plan: Physical exam  Estrogen deficiency - Plan: DG Bone Density  Screening for hyperlipidemia - Plan: Lipid panel  Encounter for hepatitis C screening test for low risk patient - Plan: Hepatitis C antibody  Screening for diabetes mellitus - Plan: Comprehensive metabolic panel, Hemoglobin A1c  Screening for deficiency anemia - Plan: CBC  Screening for colon cancer  Complete physical exam today.  Ordered routine labs, and a bone density scan. We did a pelvic exam today to confirm if she had a cervix.  She does not have a cervix, she declines Pap of the vaginal cuff today. We discussed apparent "bladder drop" on recent  ultrasound.  As per my knowledge, there is not anything in particular she can do to prevent bladder symptoms recurring in the future, except for perhaps Kegel exercises.  Thankfully she has not of any such symptoms at this time.  We will be in touch with her pending her labs Also reminded that tetanus vaccine with labs Ordered Cologuard  Signed Lamar Blinks, MD  Received her labs as below, message to patient Results for orders placed or performed in visit on 08/31/18  CBC  Result Value Ref Range   WBC 6.5 4.0 - 10.5 K/uL   RBC 4.77 3.87 - 5.11 Mil/uL   Platelets 283.0 150.0 - 400.0 K/uL   Hemoglobin 15.2 (H) 12.0 - 15.0 g/dL   HCT 45.5 36.0 - 46.0 %   MCV 95.4 78.0 - 100.0 fl   MCHC 33.5 30.0 - 36.0 g/dL   RDW 12.8 11.5 - 15.5 %  Comprehensive metabolic panel  Result Value Ref Range   Sodium 139 135 - 145 mEq/L   Potassium 5.0 3.5 - 5.1 mEq/L   Chloride 102 96 - 112 mEq/L   CO2 31 19 - 32 mEq/L   Glucose, Bld 91 70 - 99 mg/dL   BUN 19 6 - 23 mg/dL   Creatinine, Ser 0.93 0.40 - 1.20 mg/dL   Total Bilirubin 0.6 0.2 - 1.2 mg/dL   Alkaline Phosphatase 54 39 - 117 U/L   AST 23 0 - 37 U/L   ALT 21 0 - 35 U/L   Total Protein 6.6 6.0 - 8.3 g/dL   Albumin 4.5 3.5 - 5.2 g/dL   Calcium 9.5 8.4 - 10.5 mg/dL   GFR 61.18 >60.00 mL/min  Hemoglobin A1c  Result Value Ref Range   Hgb A1c MFr Bld 5.3 4.6 - 6.5 %  Lipid panel  Result Value Ref Range   Cholesterol 198 0 - 200 mg/dL   Triglycerides 91.0 0.0 - 149.0 mg/dL   HDL 78.40 >39.00 mg/dL   VLDL 18.2  0.0 - 40.0 mg/dL   LDL Cholesterol 101 (H) 0 - 99 mg/dL   Total CHOL/HDL Ratio 3    NonHDL 119.42

## 2018-08-31 ENCOUNTER — Ambulatory Visit (INDEPENDENT_AMBULATORY_CARE_PROVIDER_SITE_OTHER): Payer: BLUE CROSS/BLUE SHIELD | Admitting: Family Medicine

## 2018-08-31 ENCOUNTER — Encounter: Payer: Self-pay | Admitting: Family Medicine

## 2018-08-31 VITALS — BP 110/62 | HR 64 | Temp 98.6°F | Resp 16 | Ht 67.0 in | Wt 135.0 lb

## 2018-08-31 DIAGNOSIS — Z13220 Encounter for screening for lipoid disorders: Secondary | ICD-10-CM

## 2018-08-31 DIAGNOSIS — Z1159 Encounter for screening for other viral diseases: Secondary | ICD-10-CM

## 2018-08-31 DIAGNOSIS — Z130 Encounter for screening for diseases of the blood and blood-forming organs and certain disorders involving the immune mechanism: Secondary | ICD-10-CM | POA: Diagnosis not present

## 2018-08-31 DIAGNOSIS — Z0000 Encounter for general adult medical examination without abnormal findings: Secondary | ICD-10-CM | POA: Diagnosis not present

## 2018-08-31 DIAGNOSIS — E2839 Other primary ovarian failure: Secondary | ICD-10-CM | POA: Diagnosis not present

## 2018-08-31 DIAGNOSIS — Z131 Encounter for screening for diabetes mellitus: Secondary | ICD-10-CM | POA: Diagnosis not present

## 2018-08-31 DIAGNOSIS — Z1211 Encounter for screening for malignant neoplasm of colon: Secondary | ICD-10-CM

## 2018-08-31 LAB — COMPREHENSIVE METABOLIC PANEL
ALT: 21 U/L (ref 0–35)
AST: 23 U/L (ref 0–37)
Albumin: 4.5 g/dL (ref 3.5–5.2)
Alkaline Phosphatase: 54 U/L (ref 39–117)
BUN: 19 mg/dL (ref 6–23)
CO2: 31 meq/L (ref 19–32)
Calcium: 9.5 mg/dL (ref 8.4–10.5)
Chloride: 102 mEq/L (ref 96–112)
Creatinine, Ser: 0.93 mg/dL (ref 0.40–1.20)
GFR: 61.18 mL/min (ref >60.00)
Glucose, Bld: 91 mg/dL (ref 70–99)
Potassium: 5 mEq/L (ref 3.5–5.1)
SODIUM: 139 meq/L (ref 135–145)
Total Bilirubin: 0.6 mg/dL (ref 0.2–1.2)
Total Protein: 6.6 g/dL (ref 6.0–8.3)

## 2018-08-31 LAB — CBC
HCT: 45.5 % (ref 36.0–46.0)
Hemoglobin: 15.2 g/dL — ABNORMAL HIGH (ref 12.0–15.0)
MCHC: 33.5 g/dL (ref 30.0–36.0)
MCV: 95.4 fl (ref 78.0–100.0)
Platelets: 283 10*3/uL (ref 150.0–400.0)
RBC: 4.77 Mil/uL (ref 3.87–5.11)
RDW: 12.8 % (ref 11.5–15.5)
WBC: 6.5 10*3/uL (ref 4.0–10.5)

## 2018-08-31 LAB — LIPID PANEL
CHOL/HDL RATIO: 3
Cholesterol: 198 mg/dL (ref 0–200)
HDL: 78.4 mg/dL (ref >39.00)
LDL CALC: 101 mg/dL — AB (ref 0–99)
NONHDL: 119.42
Triglycerides: 91 mg/dL (ref 0.0–149.0)
VLDL: 18.2 mg/dL (ref 0.0–40.0)

## 2018-08-31 LAB — HEMOGLOBIN A1C: Hgb A1c MFr Bld: 5.3 % (ref 4.6–6.5)

## 2018-08-31 NOTE — Patient Instructions (Signed)
It was good to see you today, as always.  I will be in touch with your labs ASAP.  We will order a bone density study for you to be done here at the med center.  We will also set up Cologuard for you  I will be in touch with your labs You do not have a cervix, so you do not need to continue cervical cancer screening/pap  Health Maintenance, Female Adopting a healthy lifestyle and getting preventive care can go a long way to promote health and wellness. Talk with your health care provider about what schedule of regular examinations is right for you. This is a good chance for you to check in with your provider about disease prevention and staying healthy. In between checkups, there are plenty of things you can do on your own. Experts have done a lot of research about which lifestyle changes and preventive measures are most likely to keep you healthy. Ask your health care provider for more information. Weight and diet Eat a healthy diet  Be sure to include plenty of vegetables, fruits, low-fat dairy products, and lean protein.  Do not eat a lot of foods high in solid fats, added sugars, or salt.  Get regular exercise. This is one of the most important things you can do for your health. ? Most adults should exercise for at least 150 minutes each week. The exercise should increase your heart rate and make you sweat (moderate-intensity exercise). ? Most adults should also do strengthening exercises at least twice a week. This is in addition to the moderate-intensity exercise. Maintain a healthy weight  Body mass index (BMI) is a measurement that can be used to identify possible weight problems. It estimates body fat based on height and weight. Your health care provider can help determine your BMI and help you achieve or maintain a healthy weight.  For females 70 years of age and older: ? A BMI below 18.5 is considered underweight. ? A BMI of 18.5 to 24.9 is normal. ? A BMI of 25 to 29.9 is  considered overweight. ? A BMI of 30 and above is considered obese. Watch levels of cholesterol and blood lipids  You should start having your blood tested for lipids and cholesterol at 62 years of age, then have this test every 5 years.  You may need to have your cholesterol levels checked more often if: ? Your lipid or cholesterol levels are high. ? You are older than 62 years of age. ? You are at high risk for heart disease. Cancer screening Lung Cancer  Lung cancer screening is recommended for adults 42-43 years old who are at high risk for lung cancer because of a history of smoking.  A yearly low-dose CT scan of the lungs is recommended for people who: ? Currently smoke. ? Have quit within the past 15 years. ? Have at least a 30-pack-year history of smoking. A pack year is smoking an average of one pack of cigarettes a day for 1 year.  Yearly screening should continue until it has been 15 years since you quit.  Yearly screening should stop if you develop a health problem that would prevent you from having lung cancer treatment. Breast Cancer  Practice breast self-awareness. This means understanding how your breasts normally appear and feel.  It also means doing regular breast self-exams. Let your health care provider know about any changes, no matter how small.  If you are in your 20s or 30s, you should  have a clinical breast exam (CBE) by a health care provider every 1-3 years as part of a regular health exam.  If you are 73 or older, have a CBE every year. Also consider having a breast X-ray (mammogram) every year.  If you have a family history of breast cancer, talk to your health care provider about genetic screening.  If you are at high risk for breast cancer, talk to your health care provider about having an MRI and a mammogram every year.  Breast cancer gene (BRCA) assessment is recommended for women who have family members with BRCA-related cancers. BRCA-related  cancers include: ? Breast. ? Ovarian. ? Tubal. ? Peritoneal cancers.  Results of the assessment will determine the need for genetic counseling and BRCA1 and BRCA2 testing. Cervical Cancer Your health care provider may recommend that you be screened regularly for cancer of the pelvic organs (ovaries, uterus, and vagina). This screening involves a pelvic examination, including checking for microscopic changes to the surface of your cervix (Pap test). You may be encouraged to have this screening done every 3 years, beginning at age 71.  For women ages 7-65, health care providers may recommend pelvic exams and Pap testing every 3 years, or they may recommend the Pap and pelvic exam, combined with testing for human papilloma virus (HPV), every 5 years. Some types of HPV increase your risk of cervical cancer. Testing for HPV may also be done on women of any age with unclear Pap test results.  Other health care providers may not recommend any screening for nonpregnant women who are considered low risk for pelvic cancer and who do not have symptoms. Ask your health care provider if a screening pelvic exam is right for you.  If you have had past treatment for cervical cancer or a condition that could lead to cancer, you need Pap tests and screening for cancer for at least 20 years after your treatment. If Pap tests have been discontinued, your risk factors (such as having a new sexual partner) need to be reassessed to determine if screening should resume. Some women have medical problems that increase the chance of getting cervical cancer. In these cases, your health care provider may recommend more frequent screening and Pap tests. Colorectal Cancer  This type of cancer can be detected and often prevented.  Routine colorectal cancer screening usually begins at 62 years of age and continues through 62 years of age.  Your health care provider may recommend screening at an earlier age if you have risk  factors for colon cancer.  Your health care provider may also recommend using home test kits to check for hidden blood in the stool.  A small camera at the end of a tube can be used to examine your colon directly (sigmoidoscopy or colonoscopy). This is done to check for the earliest forms of colorectal cancer.  Routine screening usually begins at age 84.  Direct examination of the colon should be repeated every 5-10 years through 62 years of age. However, you may need to be screened more often if early forms of precancerous polyps or small growths are found. Skin Cancer  Check your skin from head to toe regularly.  Tell your health care provider about any new moles or changes in moles, especially if there is a change in a mole's shape or color.  Also tell your health care provider if you have a mole that is larger than the size of a pencil eraser.  Always use sunscreen. Apply  sunscreen liberally and repeatedly throughout the day.  Protect yourself by wearing long sleeves, pants, a wide-brimmed hat, and sunglasses whenever you are outside. Heart disease, diabetes, and high blood pressure  High blood pressure causes heart disease and increases the risk of stroke. High blood pressure is more likely to develop in: ? People who have blood pressure in the high end of the normal range (130-139/85-89 mm Hg). ? People who are overweight or obese. ? People who are African American.  If you are 13-78 years of age, have your blood pressure checked every 3-5 years. If you are 24 years of age or older, have your blood pressure checked every year. You should have your blood pressure measured twice-once when you are at a hospital or clinic, and once when you are not at a hospital or clinic. Record the average of the two measurements. To check your blood pressure when you are not at a hospital or clinic, you can use: ? An automated blood pressure machine at a pharmacy. ? A home blood pressure  monitor.  If you are between 63 years and 53 years old, ask your health care provider if you should take aspirin to prevent strokes.  Have regular diabetes screenings. This involves taking a blood sample to check your fasting blood sugar level. ? If you are at a normal weight and have a low risk for diabetes, have this test once every three years after 62 years of age. ? If you are overweight and have a high risk for diabetes, consider being tested at a younger age or more often. Preventing infection Hepatitis B  If you have a higher risk for hepatitis B, you should be screened for this virus. You are considered at high risk for hepatitis B if: ? You were born in a country where hepatitis B is common. Ask your health care provider which countries are considered high risk. ? Your parents were born in a high-risk country, and you have not been immunized against hepatitis B (hepatitis B vaccine). ? You have HIV or AIDS. ? You use needles to inject street drugs. ? You live with someone who has hepatitis B. ? You have had sex with someone who has hepatitis B. ? You get hemodialysis treatment. ? You take certain medicines for conditions, including cancer, organ transplantation, and autoimmune conditions. Hepatitis C  Blood testing is recommended for: ? Everyone born from [birth year] through 1964. ? Anyone with known risk factors for hepatitis C. Sexually transmitted infections (STIs)  You should be screened for sexually transmitted infections (STIs) including gonorrhea and chlamydia if: ? You are sexually active and are younger than 62 years of age. ? You are older than 62 years of age and your health care provider tells you that you are at risk for this type of infection. ? Your sexual activity has changed since you were last screened and you are at an increased risk for chlamydia or gonorrhea. Ask your health care provider if you are at risk.  If you do not have HIV, but are at risk, it may be  recommended that you take a prescription medicine daily to prevent HIV infection. This is called pre-exposure prophylaxis (PrEP). You are considered at risk if: ? You are sexually active and do not regularly use condoms or know the HIV status of your partner(s). ? You take drugs by injection. ? You are sexually active with a partner who has HIV. Talk with your health care provider about whether you are  at high risk of being infected with HIV. If you choose to begin PrEP, you should first be tested for HIV. You should then be tested every 3 months for as long as you are taking PrEP. Pregnancy  If you are premenopausal and you may become pregnant, ask your health care provider about preconception counseling.  If you may become pregnant, take 400 to 800 micrograms (mcg) of folic acid every day.  If you want to prevent pregnancy, talk to your health care provider about birth control (contraception). Osteoporosis and menopause  Osteoporosis is a disease in which the bones lose minerals and strength with aging. This can result in serious bone fractures. Your risk for osteoporosis can be identified using a bone density scan.  If you are 33 years of age or older, or if you are at risk for osteoporosis and fractures, ask your health care provider if you should be screened.  Ask your health care provider whether you should take a calcium or vitamin D supplement to lower your risk for osteoporosis.  Menopause may have certain physical symptoms and risks.  Hormone replacement therapy may reduce some of these symptoms and risks. Talk to your health care provider about whether hormone replacement therapy is right for you. Follow these instructions at home:  Schedule regular health, dental, and eye exams.  Stay current with your immunizations.  Do not use any tobacco products including cigarettes, chewing tobacco, or electronic cigarettes.  If you are pregnant, do not drink alcohol.  If you are  breastfeeding, limit how much and how often you drink alcohol.  Limit alcohol intake to no more than 1 drink per day for nonpregnant women. One drink equals 12 ounces of beer, 5 ounces of wine, or 1 ounces of hard liquor.  Do not use street drugs.  Do not share needles.  Ask your health care provider for help if you need support or information about quitting drugs.  Tell your health care provider if you often feel depressed.  Tell your health care provider if you have ever been abused or do not feel safe at home. This information is not intended to replace advice given to you by your health care provider. Make sure you discuss any questions you have with your health care provider. Document Released: 02/04/2011 Document Revised: 12/28/2015 Document Reviewed: 04/25/2015 Elsevier Interactive Patient Education  2019 [employer].

## 2018-09-01 LAB — HEPATITIS C ANTIBODY
HEP C AB: NONREACTIVE
SIGNAL TO CUT-OFF: 0.02 (ref <1.00)

## 2018-09-04 ENCOUNTER — Ambulatory Visit (HOSPITAL_BASED_OUTPATIENT_CLINIC_OR_DEPARTMENT_OTHER)
Admission: RE | Admit: 2018-09-04 | Discharge: 2018-09-04 | Disposition: A | Discharge status: Home / Self Care | Payer: BLUE CROSS/BLUE SHIELD | Source: Ambulatory Visit | Attending: Family Medicine | Admitting: Family Medicine

## 2018-09-04 ENCOUNTER — Encounter: Payer: Self-pay | Admitting: Family Medicine

## 2018-09-04 DIAGNOSIS — E2839 Other primary ovarian failure: Secondary | ICD-10-CM | POA: Diagnosis not present

## 2018-09-09 ENCOUNTER — Encounter: Payer: Self-pay | Admitting: Family Medicine

## 2018-09-09 LAB — COLOGUARD: COLOGUARD: NEGATIVE

## 2018-09-16 ENCOUNTER — Encounter: Payer: Self-pay | Admitting: Family Medicine

## 2018-09-17 ENCOUNTER — Telehealth: Payer: Self-pay | Admitting: *Deleted

## 2018-09-17 NOTE — Telephone Encounter (Signed)
Received Cologuard Results; forwarded to provider/SLS 02/13

## 2018-10-18 ENCOUNTER — Encounter: Payer: Self-pay | Admitting: Family Medicine

## 2018-11-21 ENCOUNTER — Telehealth: Payer: BLUE CROSS/BLUE SHIELD | Admitting: Physician Assistant

## 2018-11-21 DIAGNOSIS — Z20822 Contact with and (suspected) exposure to covid-19: Secondary | ICD-10-CM

## 2018-11-21 DIAGNOSIS — B349 Viral infection, unspecified: Secondary | ICD-10-CM

## 2018-11-21 DIAGNOSIS — Z20828 Contact with and (suspected) exposure to other viral communicable diseases: Secondary | ICD-10-CM

## 2018-11-21 MED ORDER — BENZONATATE 100 MG PO CAPS: 100.0000 mg | ORAL_CAPSULE | Freq: Three times a day (TID) | ORAL | 0 refills | Status: DC | PRN

## 2018-11-21 NOTE — Progress Notes (Signed)
I have spent 5 minutes in review of e-visit questionnaire, review and updating patient chart, medical decision making and response to patient.   Harvy Riera Cody Orvis Stann, PA-C    

## 2018-11-21 NOTE — Progress Notes (Signed)
E-Visit for Corona Virus Screening  I see that you have been speaking with your PCP, Dr. Edilia Bo about your symptoms, starting on the 15th and the potential for a flu-like illness versus COVID. I recommend you continue quarantine, hydrate and rest. I will send in a prescription cough medication for you. Tylenol for aches, fever if needed. No indication for antibiotic presently. Keep close eye on symptoms. If you note any worsening breathing, please let us know. If severe, please go be seen at the ER. Follow-up via MyChart or Video Visit with Dr. Lorelei Pont on Monday for repeat assessment of symptoms.  Coronavirus disease 2019 (COVID-19) is a respiratory illness that can spread from person to person. The virus that causes COVID-19 is a new virus that was first identified in the country of [place] but is now found in multiple other countries and has spread to the [place].  Symptoms associated with the virus are mild to severe fever, cough, and shortness of breath. There is currently no vaccine to protect against COVID-19, and there is no specific antiviral treatment for the virus.   To be considered HIGH RISK for Coronavirus (COVID-19), you have to meet the following criteria:  . Traveled to [place], [place], [place], [place] or [place]; or in the [place] to [place], [place], [place], or [place]; and have fever, cough, and shortness of breath within the last 2 weeks of travel OR  . Been in close contact with a person diagnosed with COVID-19 within the last 2 weeks and have fever, cough, and shortness of breath  . IF YOU DO NOT MEET THESE CRITERIA, YOU ARE CONSIDERED LOW RISK FOR COVID-19.   It is vitally important that if you feel that you have an infection such as this virus or any other virus that you stay home and away from places where you may spread it to others.  You should self-quarantine for 14 days if you have symptoms that could potentially be coronavirus and avoid contact with  people age 59 and older.     Reduce your risk of any infection by using the same precautions used for avoiding the common cold or flu:  Marland Kitchen Wash your hands often with soap and warm water for at least 20 seconds.  If soap and water are not readily available, use an alcohol-based hand sanitizer with at least 60% alcohol.  . If coughing or sneezing, cover your mouth and nose by coughing or sneezing into the elbow areas of your shirt or coat, into a tissue or into your sleeve (not your hands). . Avoid shaking hands with others and consider head nods or verbal greetings only. . Avoid touching your eyes, nose, or mouth with unwashed hands.  . Avoid close contact with people who are sick. . Avoid places or events with large numbers of people in one location, like concerts or sporting events. . Carefully consider travel plans you have or are making. . If you are planning any travel outside or inside the [place], visit the CDC's Travelers' Health webpage for the latest health notices. . If you have some symptoms but not all symptoms, continue to monitor at home and seek medical attention if your symptoms worsen. . If you are having a medical emergency, call 911.  HOME CARE . Only take medications as instructed by your medical team. . Drink plenty of fluids and get plenty of rest. . A steam or ultrasonic humidifier can help if you have congestion.   GET HELP  RIGHT AWAY IF: . You develop worsening fever. . You become short of breath . You cough up blood. . Your symptoms become more severe MAKE SURE YOU   Understand these instructions.  Will watch your condition.  Will get help right away if you are not doing well or get worse.  Your e-visit answers were reviewed by a board certified advanced clinical practitioner to complete your personal care plan.  Depending on the condition, your plan could have included both over the counter or prescription medications.  If there is a problem please reply once  you have received a response from your provider. Your safety is important to [place].  If you have drug allergies check your prescription carefully.    You can use MyChart to ask questions about today's visit, request a non-urgent call back, or ask for a work or school excuse for 24 hours related to this e-Visit. If it has been greater than 24 hours you will need to follow up with your provider, or enter a new e-Visit to address those concerns. You will get an e-mail in the next two days asking about your experience.  I hope that your e-visit has been valuable and will speed your recovery. Thank you for using e-visits.

## 2018-11-23 ENCOUNTER — Encounter: Payer: Self-pay | Admitting: Family Medicine

## 2018-11-23 ENCOUNTER — Ambulatory Visit (INDEPENDENT_AMBULATORY_CARE_PROVIDER_SITE_OTHER): Payer: BLUE CROSS/BLUE SHIELD | Admitting: Family Medicine

## 2018-11-23 ENCOUNTER — Ambulatory Visit: Payer: Self-pay | Admitting: *Deleted

## 2018-11-23 ENCOUNTER — Other Ambulatory Visit: Payer: Self-pay

## 2018-11-23 DIAGNOSIS — B349 Viral infection, unspecified: Secondary | ICD-10-CM

## 2018-11-23 MED ORDER — FLECAINIDE ACETATE 100 MG PO TABS: 100.0000 mg | ORAL_TABLET | Freq: Two times a day (BID) | ORAL | 0 refills | Status: DC

## 2018-11-23 NOTE — Telephone Encounter (Signed)
Pt reports did EVisit Saturday at onset of symptoms: dry cough, body aches, lightheadedness, chest tightness. States afebrile. States PA directed her to follow up with PCP today. Pt reports symptoms "Same as weekend, no worse."  Cough is non-productive. No travel, no known exposure. Does state she has been out a lot, "Not staying at home."  E mail and phone # verified. Pt transferred to practice, for consideration of appt. CB [phone].  Reason for Disposition . [1] Continuous (nonstop) coughing interferes with work or school AND [2] no improvement using cough treatment per protocol  Answer Assessment - Initial Assessment Questions 1. COVID-19 DIAGNOSIS: "Who made your Coronavirus (COVID-19) diagnosis?" "Was it confirmed by a positive lab test?" If not diagnosed by a HCP, ask "Are there lots of cases (community spread) where you live?" (See public health department website, if unsure)   * MAJOR community spread: high number of cases; numbers of cases are increasing; many people hospitalized.   * MINOR community spread: low number of cases; not increasing; few or no people hospitalized     N/A 2. ONSET: "When did the COVID-19 symptoms start?"      Saturday 3. WORST SYMPTOM: "What is your worst symptom?" (e.g., cough, fever, shortness of breath, muscle aches)     Cough 4. COUGH: "How bad is the cough?"       Moderate 5. FEVER: "Do you have a fever?" If so, ask: "What is your temperature, how was it measured, and when did it start?"     no 6. RESPIRATORY STATUS: "Describe your breathing?" (e.g., shortness of breath, wheezing, unable to speak)    "Feels like bronchitis"   7. BETTER-SAME-WORSE: "Are you getting better, staying the same or getting worse compared to yesterday?"  If getting worse, ask, "In what way?"    Same 8. HIGH RISK DISEASE: "Do you have any chronic medical problems?" (e.g., asthma, heart or lung disease, weak immune system, etc.)     no  10. OTHER SYMPTOMS: "Do you have  any other symptoms?"  (e.g., runny nose, headache, sore throat, loss of smell)       Body aches  Protocols used: CORONAVIRUS (COVID-19) DIAGNOSED OR SUSPECTED-A-AH

## 2018-11-23 NOTE — Telephone Encounter (Signed)
Patient scheduled for evisit this morning. FYI.

## 2018-11-23 NOTE — Progress Notes (Signed)
[place] [facility] [address], [place], [place] [address] [phone]  Date:  11/23/2018   Name:  Christy Hill   DOB:  June 24, 1956   MRN:  [id number]  PCP:  Darreld Mclean, MD    Chief Complaint: No chief complaint on file.   History of Present Illness:  Christy Hill is a 63 y.o. very pleasant female patient who presents with the following:  Virtual visit due to COVID Pt location is home Provider is at office Pt ID confirmed with name and DOB  History of atrial fib, hyperthyroidism/ Graves disease.  Takes flecainide- 100 mg BID  Her cardiologist is with [facility]- they talked about doing an ablation last year but she did not end up doing this She can tell when she is in a fib "sometimes" Yesterday she was more tachycardic for several hours, however pulse is now back to normal  Pt called in with the following concern: Pt reports did EVisit Saturday at onset of symptoms: dry cough, body aches, lightheadedness, chest tightness. States afebrile. States PA directed her to follow up with PCP today. Pt reports symptoms "Same as weekend, no worse."  Cough is non-productive. No travel, no known exposure. Does state she has been out a lot, "Not staying at home."  She did an evisit with Elyn Aquas over the weekend- he advised her to recover at home from viral illness-perhaps COVID-19.  However as sx not severe she was not directed to the ER   Today is Monday.  She notes that on Saturday she awoke with HA, body aches, cough, chest congestion No fever  The cough is generally dry She had nausea this am and diarrhea as well- which is new  Since Saturday she feels about the same except for nausea and diarrhea which started today.  No vomiting however  She is just concerned, would like to know she has COVID-19.   Patient Active Problem List   Diagnosis Date Noted  . Dysmenorrhea 12/13/2014  . Graves' ophthalmopathy 09/14/2013  .  Synovitis of finger 09/14/2013  . Thyroid eye disease 04/05/2013  . Vaginal spotting 12/10/2012  . Hyperthyroidism 06/22/2012  . History of fibromyalgia 06/21/2012  . Allergic rhinitis 06/21/2012  . Menopause 06/11/2012  . History of migraine headaches 06/11/2012  . Depression 06/11/2012  . S/P cholecystectomy 06/11/2012  . S/P exploratory laparotomy 06/11/2012  . New onset atrial fibrillation ([place]) 06/11/2012    Past Medical History:  Diagnosis Date  . Adverse effect of general anesthetic    pt has graves disase and needs eye ointment to keeps lubricated  . Anemia    currently taking iron  . Asthma    no meds, years no problems  . Atrial fibrillation ([place])    secondary to thyroid  . Fibromyalgia   . Hyperthyroidism   . Migraines    history  . PONV (postoperative nausea and vomiting)    after all surgeries including colonoscopy  . Thyroid disease     Past Surgical History:  Procedure Laterality Date  . APPENDECTOMY    . BILATERAL SALPINGECTOMY Bilateral 12/13/2014   Procedure: BILATERAL SALPINGECTOMY;  Surgeon: Brien Few, MD;  Location: [facility];  Service: Gynecology;  Laterality: Bilateral;  . BREAST ENHANCEMENT SURGERY    . BREAST IMPLANT REMOVAL    . BUNIONECTOMY    . CESAREAN SECTION    . CHOLECYSTECTOMY    . COLONOSCOPY    . DILATATION &  CURRETTAGE/HYSTEROSCOPY WITH RESECTOCOPE N/A 03/31/2013   Procedure: DILATATION & CURETTAGE/HYSTEROSCOPY WITH RESECTOCOPE;  Surgeon: Linda Hedges, DO;  Location: [facility];  Service: Gynecology;  Laterality: N/A;  . ENDOMETRIAL ABLATION    . KNEE ARTHROSCOPY    . ROBOTIC ASSISTED TOTAL HYSTERECTOMY N/A 12/13/2014   Procedure: ROBOTIC ASSISTED TOTAL HYSTERECTOMY;  Surgeon: Brien Few, MD;  Location: [facility];  Service: Gynecology;  Laterality: N/A;  2 1/2 hrs.    Social History   Tobacco Use  . Smoking status: Never Smoker  . Smokeless tobacco: Never Used  Substance Use Topics  . Alcohol use: Yes    Alcohol/week: 1.0 -  2.0 standard drinks    Types: 1 - 2 Standard drinks or equivalent per week    Comment: HAD ETOH ON TUESDAY  . Drug use: No    Family History  Problem Relation Age of Onset  . Depression Mother   . Dementia Mother   . Cancer Father   . Dementia Father   . Depression Sister   . Depression Brother   . Pernicious anemia Paternal Aunt   . Pernicious anemia Paternal Grandmother   . Alcohol abuse Neg Hx   . Bipolar disorder Son     Allergies  Allergen Reactions  . Influenza Vaccines Other (See Comments)    AFIB  . Gluten Meal   . Lac Bovis Nausea Only    Cow milk only  . Molds & Smuts Other (See Comments)    Sick to stomach - headache  . Pollen Extract Other (See Comments)    sneezing  . Wheat Bran Other (See Comments)    Medication list has been reviewed and updated.  Current Outpatient Medications on File Prior to Visit  Medication Sig Dispense Refill  . Ascorbic Acid (VITAMIN C) 1000 MG tablet Take 1,000 mg by mouth 3 (three) times daily.    . ASHWAGANDHA PO Take 300 mg by mouth daily.    Marland Kitchen b complex vitamins tablet Take 1 tablet by mouth daily.    . benzonatate (TESSALON) 100 MG capsule Take 1 capsule (100 mg total) by mouth 3 (three) times daily as needed for cough. 30 capsule 0  . Cholecalciferol (VITAMIN D3) 5000 UNITS CAPS Take 1 capsule by mouth daily.    Marland Kitchen estradiol (CLIMARA - DOSED IN MG/24 HR) 0.05 mg/24hr patch Place 0.05 mg onto the skin once a week.    . Multiple Vitamins-Minerals (ZINC PO) Take 1 tablet by mouth daily. With copper    . NALTREXONE HCL PO Take 4.5 mg by mouth daily. For auto immune issues    . Nutritional Supplements (DHEA PO) Take 100 mg by mouth daily.    . Omega-3 Fatty Acids (FISH OIL) 1000 MG CAPS Take 1 capsule by mouth 3 (three) times daily.    . Probiotic Product (PROBIOTIC PO) Take 1 capsule by mouth daily.    . progesterone (PROMETRIUM) 100 MG capsule Take 400 mg by mouth at bedtime.    . Selenium 200 MCG CAPS Take 200 mcg by mouth  daily.    . Testosterone 10 MG/ACT (2%) GEL Place 0.25-0.5 mLs onto the skin at bedtime. Apply behind the knee    . VITAMIN K PO Take 900 mcg by mouth daily.     No current facility-administered medications on file prior to visit.     Review of Systems:  As per HPI- otherwise negative. No rash No fever noted   Physical Examination: There were no vitals filed for this visit.  There were no vitals filed for this visit. There is no height or weight on file to calculate BMI. Ideal Body Weight:    She did some vitals at home-  BP: 142/91 Pulse: 63 Sat: 97  Patient is observed over video.  She looks well, no cough, wheezing, tachypnea is apparent  Assessment and Plan: Viral syndrome  Virtual visit today for illness.  Starlit notes that she has been ill for about 2 days.  Not running a fever, but she has noted cough, congestion, and today some nausea and diarrhea.  She is interested in being tested for COVID-19.  I did call our emergency department here at the med center to inquire, due to lack of test swabs they are only testing people who are seriously ill and need to be admitted.  I called patient back and gave her this information.  She is disappointed, would like to be tested, which is certainly understandable.  However at this time she does not feel that she needs emergency evaluation.  I offered to call in an antibiotic for possible bronchitis, but she declines at this time  I instructed her to self isolate until she has been ill for a total of 7 days, or feeling better for 3 days, which ever takes longer.  She will seek care if she is getting worse  Pt given pt instructions as follows:    It was nice to talk with you today, I am sorry that we cannot offer you testing for COVID-19.  I certainly understand why you would like to be tested, and wish that we could make this happen.  For the time being, I would assume that you could have COVID-19.  Please self isolate at home for 7 days,  or until you have been feeling much better/no fever for 72 hours-whichever is longer  As long as you are not in distress, please continue to recover at home.  However if you are feeling seriously ill or are having difficulty breathing please do seek care at the emergency room  Please contact me via my chart if I can do anything to assist you  Signed Lamar Blinks, MD

## 2018-11-23 NOTE — Patient Instructions (Signed)
It was nice to talk with you today, I am sorry that we cannot offer you testing for COVID-19.  I certainly understand why you would like to be tested, and wish that we could make this happen.  For the time being, I would assume that you could have COVID-19.  Please self isolate at home for 7 days, or until you have been feeling much better/no fever for 72 hours-whichever is longer  As long as you are not in distress, please continue to recover at home.  However if you are feeling seriously ill or are having difficulty breathing please do seek care at the emergency room  Please contact me via my chart if I can do anything to assist you

## 2019-01-26 ENCOUNTER — Encounter: Payer: Self-pay | Admitting: Family Medicine

## 2019-03-18 ENCOUNTER — Other Ambulatory Visit: Payer: Self-pay

## 2019-03-18 ENCOUNTER — Ambulatory Visit: Payer: BC Managed Care – PPO | Admitting: Family Medicine

## 2019-03-18 ENCOUNTER — Encounter: Payer: Self-pay | Admitting: Family Medicine

## 2019-03-18 ENCOUNTER — Ambulatory Visit (HOSPITAL_BASED_OUTPATIENT_CLINIC_OR_DEPARTMENT_OTHER)
Admission: RE | Admit: 2019-03-18 | Discharge: 2019-03-18 | Disposition: A | Discharge status: Home / Self Care | Payer: BC Managed Care – PPO | Source: Ambulatory Visit | Attending: Family Medicine | Admitting: Family Medicine

## 2019-03-18 VITALS — BP 106/60 | HR 60 | Temp 98.0°F | Resp 18 | Ht 67.0 in | Wt 137.2 lb

## 2019-03-18 DIAGNOSIS — M79676 Pain in unspecified toe(s): Secondary | ICD-10-CM | POA: Diagnosis not present

## 2019-03-18 NOTE — Patient Instructions (Signed)
Toe Fracture  A toe fracture is a break in one of the toe bones (phalanges). A toe fracture may be:  · A crack in the surface of the bone (stress fracture). This often occurs in athletes.  · A break all the way through the bone (complete fracture).  What are the causes?  This condition may be caused by:  · Direct impact, such as from dropping a heavy object on your toe.  · Stubbing your toe.  · Twisting or stretching your toe out of place.  · Overuse or repetitive exercise.  What increases the risk?  You are more likely to develop this condition if you:  · Play contact sports.  · Have a condition that causes the bones to become thin and brittle (osteoporosis).  · Have a low calcium level.  What are the signs or symptoms?  The main symptoms of this condition are swelling and pain in the toe. Other symptoms include:  · Bruising.  · Stiffness.  · Numbness.  · A change in the way the toe looks.  · Broken bones that poke through the skin.  · Blood beneath the toenail.  How is this diagnosed?  This condition is diagnosed with a physical exam. You may also have X-rays.  How is this treated?  Treatment for this condition depends on the type of fracture and its severity. Treatment may include:  · Taping the broken toe to a toe that is next to it (buddy taping). This is the most common treatment for fractures in which the bone has not moved out of place (non-displaced fracture).  · Wearing a shoe that has a wide, rigid sole to protect the toe and to limit its movement.  · Wearing a walking cast.  · Having a procedure to move the toe back into place.  · Surgery. This may be needed if the:  ? Pieces of broken bone are out of place (displaced).  ? Bone breaks through the skin.  · Physical therapy. This is done to help regain movement and strength in the toe.  You may need follow-up X-rays to make sure that the bone is healing well and staying in position.  Follow these instructions at home:  If you have a shoe:  · Wear the shoe  as told by your health care provider. Remove it only as told by your health care provider.  · Loosen the shoe if your toes tingle, become numb, or turn cold and blue.  · Keep the shoe clean and dry.  If you have a cast:  · Do not put pressure on any part of the cast until it is fully hardened. This may take several hours.  · Do not stick anything inside the cast to scratch your skin. Doing that increases your risk of infection.  · Check the skin around the cast every day. Tell your health care provider about any concerns.  · You may put lotion on dry skin around the edges of the cast. Do not put lotion on the skin underneath the cast.  · Keep the cast clean and dry.  Bathing  · Do not take baths, swim, or use a hot tub until your health care provider approves. Ask your health care provider if you can take showers.  · If the shoe or cast is not waterproof:  ? Do not let it get wet.  ? Cover it with a watertight covering when you take a bath or a shower.  Activity  ·   directed. Driving  Do not drive or use heavy machinery while taking pain medicine.  Do not drive while wearing a cast on a foot that you use for driving. Managing pain, stiffness, and swelling   If directed, put ice on painful areas: ? Put ice in a plastic bag. ? Place a towel between your skin and the bag.  If you have a shoe, remove it as told by your health care provider.  If you have a cast, place a towel between your cast and the bag. ? Leave the ice on for 20 minutes, 2-3 times per day.  Raise (elevate) the injured area above the level of your heart while you are sitting or lying down. General instructions  If your toe was treated with  buddy taping, follow your health care provider's instructions for changing the gauze and tape. Change it more often if: ? The gauze and tape get wet. If this happens, dry the space between the toes. ? The gauze and tape are too tight and cause your toe to become pale or numb.  If you were not given a protective shoe, wear sturdy, supportive shoes. Your shoes should not pinch your toes and should not fit tightly against your toes.  Do not use any products that contain nicotine or tobacco, such as cigarettes and e-cigarettes. These can delay bone healing. If you need help quitting, ask your health care provider.  Take over-the-counter and prescription medicines only as told by your health care provider.  Keep all follow-up visits as told by your health care provider. This is important. Contact a health care provider if you have:  Pain that gets worse or does not get better with medicine.  A fever.  A bad smell coming from your cast. Get help right away if you have:  Any of the following in your toes or your foot: ? Numbness that gets worse. ? Tingling. ? Coldness. ? Blue skin.  Redness or swelling that gets worse.  Pain that suddenly becomes severe. Summary  A toe fracture is a break in one of the toe bones (phalanges).  Treatment depends on how severe your fracture is and how the pieces of the broken bone line up with each other. Treatment may include buddy taping, wearing a shoe or a cast, or using crutches.  Ice and elevate your foot to help lessen the pain and swelling. This information is not intended to replace advice given to you by your health care provider. Make sure you discuss any questions you have with your health care provider. Document Released: 07/19/2000 Document Revised: 11/04/2017 Document Reviewed: 09/02/2017 Elsevier Patient Education  2020 [employer].
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
## 2019-04-21 DIAGNOSIS — I4891 Unspecified atrial fibrillation: Secondary | ICD-10-CM | POA: Diagnosis not present

## 2019-04-22 DIAGNOSIS — T2611XA Burn of cornea and conjunctival sac, right eye, initial encounter: Secondary | ICD-10-CM | POA: Diagnosis not present

## 2019-06-18 DIAGNOSIS — E039 Hypothyroidism, unspecified: Secondary | ICD-10-CM | POA: Diagnosis not present

## 2019-08-09 DIAGNOSIS — D225 Melanocytic nevi of trunk: Secondary | ICD-10-CM | POA: Diagnosis not present

## 2019-08-09 DIAGNOSIS — L578 Other skin changes due to chronic exposure to nonionizing radiation: Secondary | ICD-10-CM | POA: Diagnosis not present

## 2019-08-09 DIAGNOSIS — L218 Other seborrheic dermatitis: Secondary | ICD-10-CM | POA: Diagnosis not present

## 2019-08-09 DIAGNOSIS — L821 Other seborrheic keratosis: Secondary | ICD-10-CM | POA: Diagnosis not present

## 2019-08-20 DIAGNOSIS — I480 Paroxysmal atrial fibrillation: Secondary | ICD-10-CM | POA: Diagnosis not present

## 2019-09-30 DIAGNOSIS — Z79890 Hormone replacement therapy (postmenopausal): Secondary | ICD-10-CM | POA: Diagnosis not present

## 2019-09-30 DIAGNOSIS — E0500 Thyrotoxicosis with diffuse goiter without thyrotoxic crisis or storm: Secondary | ICD-10-CM | POA: Diagnosis not present

## 2019-09-30 DIAGNOSIS — I480 Paroxysmal atrial fibrillation: Secondary | ICD-10-CM | POA: Diagnosis not present

## 2019-09-30 DIAGNOSIS — Z79899 Other long term (current) drug therapy: Secondary | ICD-10-CM | POA: Diagnosis not present

## 2019-09-30 DIAGNOSIS — I4891 Unspecified atrial fibrillation: Secondary | ICD-10-CM | POA: Diagnosis not present

## 2019-09-30 DIAGNOSIS — Z7901 Long term (current) use of anticoagulants: Secondary | ICD-10-CM | POA: Diagnosis not present

## 2019-09-30 DIAGNOSIS — Z20822 Contact with and (suspected) exposure to covid-19: Secondary | ICD-10-CM | POA: Diagnosis not present

## 2019-09-30 DIAGNOSIS — I517 Cardiomegaly: Secondary | ICD-10-CM | POA: Diagnosis not present

## 2019-09-30 DIAGNOSIS — Z01810 Encounter for preprocedural cardiovascular examination: Secondary | ICD-10-CM | POA: Diagnosis not present

## 2019-10-05 DIAGNOSIS — Z79899 Other long term (current) drug therapy: Secondary | ICD-10-CM | POA: Diagnosis not present

## 2019-10-05 DIAGNOSIS — E0500 Thyrotoxicosis with diffuse goiter without thyrotoxic crisis or storm: Secondary | ICD-10-CM | POA: Diagnosis not present

## 2019-10-05 DIAGNOSIS — I480 Paroxysmal atrial fibrillation: Secondary | ICD-10-CM | POA: Diagnosis not present

## 2019-10-05 DIAGNOSIS — Z79890 Hormone replacement therapy (postmenopausal): Secondary | ICD-10-CM | POA: Diagnosis not present

## 2019-10-05 DIAGNOSIS — Z7901 Long term (current) use of anticoagulants: Secondary | ICD-10-CM | POA: Diagnosis not present

## 2019-10-06 DIAGNOSIS — Z7901 Long term (current) use of anticoagulants: Secondary | ICD-10-CM | POA: Diagnosis not present

## 2019-10-06 DIAGNOSIS — Z79899 Other long term (current) drug therapy: Secondary | ICD-10-CM | POA: Diagnosis not present

## 2019-10-06 DIAGNOSIS — K209 Esophagitis, unspecified without bleeding: Secondary | ICD-10-CM | POA: Diagnosis not present

## 2019-10-06 DIAGNOSIS — A6920 Lyme disease, unspecified: Secondary | ICD-10-CM | POA: Diagnosis not present

## 2019-10-06 DIAGNOSIS — I480 Paroxysmal atrial fibrillation: Secondary | ICD-10-CM | POA: Diagnosis not present

## 2019-10-06 DIAGNOSIS — J029 Acute pharyngitis, unspecified: Secondary | ICD-10-CM | POA: Diagnosis not present

## 2019-10-06 DIAGNOSIS — I517 Cardiomegaly: Secondary | ICD-10-CM | POA: Diagnosis not present

## 2019-10-06 DIAGNOSIS — E049 Nontoxic goiter, unspecified: Secondary | ICD-10-CM | POA: Diagnosis not present

## 2019-10-06 DIAGNOSIS — Z9889 Other specified postprocedural states: Secondary | ICD-10-CM | POA: Diagnosis not present

## 2019-10-06 DIAGNOSIS — E0500 Thyrotoxicosis with diffuse goiter without thyrotoxic crisis or storm: Secondary | ICD-10-CM | POA: Diagnosis not present

## 2019-10-06 DIAGNOSIS — Z79890 Hormone replacement therapy (postmenopausal): Secondary | ICD-10-CM | POA: Diagnosis not present

## 2019-10-06 DIAGNOSIS — E0520 Thyrotoxicosis with toxic multinodular goiter without thyrotoxic crisis or storm: Secondary | ICD-10-CM | POA: Diagnosis not present

## 2019-10-06 DIAGNOSIS — E890 Postprocedural hypothyroidism: Secondary | ICD-10-CM | POA: Diagnosis not present

## 2019-10-06 DIAGNOSIS — Z20822 Contact with and (suspected) exposure to covid-19: Secondary | ICD-10-CM | POA: Diagnosis not present

## 2019-10-06 DIAGNOSIS — R509 Fever, unspecified: Secondary | ICD-10-CM | POA: Diagnosis not present

## 2019-10-07 DIAGNOSIS — R509 Fever, unspecified: Secondary | ICD-10-CM | POA: Diagnosis not present

## 2019-10-07 DIAGNOSIS — I480 Paroxysmal atrial fibrillation: Secondary | ICD-10-CM | POA: Diagnosis not present

## 2019-10-07 DIAGNOSIS — E049 Nontoxic goiter, unspecified: Secondary | ICD-10-CM | POA: Diagnosis not present

## 2019-10-07 DIAGNOSIS — K209 Esophagitis, unspecified without bleeding: Secondary | ICD-10-CM | POA: Diagnosis not present

## 2019-10-27 DIAGNOSIS — E049 Nontoxic goiter, unspecified: Secondary | ICD-10-CM | POA: Diagnosis not present

## 2019-11-08 DIAGNOSIS — I480 Paroxysmal atrial fibrillation: Secondary | ICD-10-CM | POA: Diagnosis not present

## 2019-11-09 DIAGNOSIS — R519 Headache, unspecified: Secondary | ICD-10-CM | POA: Diagnosis not present

## 2019-11-09 DIAGNOSIS — R05 Cough: Secondary | ICD-10-CM | POA: Diagnosis not present

## 2019-11-09 DIAGNOSIS — R509 Fever, unspecified: Secondary | ICD-10-CM | POA: Diagnosis not present

## 2019-11-09 DIAGNOSIS — Z1152 Encounter for screening for COVID-19: Secondary | ICD-10-CM | POA: Diagnosis not present

## 2019-11-18 DIAGNOSIS — R5383 Other fatigue: Secondary | ICD-10-CM | POA: Diagnosis not present

## 2019-11-18 DIAGNOSIS — R231 Pallor: Secondary | ICD-10-CM | POA: Diagnosis not present

## 2019-11-23 DIAGNOSIS — D3132 Benign neoplasm of left choroid: Secondary | ICD-10-CM | POA: Diagnosis not present

## 2019-11-26 DIAGNOSIS — Q892 Congenital malformations of other endocrine glands: Secondary | ICD-10-CM | POA: Diagnosis not present

## 2019-12-22 DIAGNOSIS — B948 Sequelae of other specified infectious and parasitic diseases: Secondary | ICD-10-CM | POA: Diagnosis not present

## 2019-12-22 DIAGNOSIS — A6920 Lyme disease, unspecified: Secondary | ICD-10-CM | POA: Diagnosis not present

## 2019-12-22 DIAGNOSIS — R5383 Other fatigue: Secondary | ICD-10-CM | POA: Diagnosis not present

## 2019-12-22 DIAGNOSIS — R7982 Elevated C-reactive protein (CRP): Secondary | ICD-10-CM | POA: Diagnosis not present

## 2019-12-22 DIAGNOSIS — E0590 Thyrotoxicosis, unspecified without thyrotoxic crisis or storm: Secondary | ICD-10-CM | POA: Diagnosis not present

## 2019-12-23 DIAGNOSIS — I480 Paroxysmal atrial fibrillation: Secondary | ICD-10-CM | POA: Diagnosis not present

## 2019-12-28 DIAGNOSIS — I4891 Unspecified atrial fibrillation: Secondary | ICD-10-CM | POA: Diagnosis not present

## 2019-12-28 DIAGNOSIS — E049 Nontoxic goiter, unspecified: Secondary | ICD-10-CM | POA: Diagnosis not present

## 2019-12-28 DIAGNOSIS — N951 Menopausal and female climacteric states: Secondary | ICD-10-CM | POA: Diagnosis not present

## 2019-12-28 DIAGNOSIS — J309 Allergic rhinitis, unspecified: Secondary | ICD-10-CM | POA: Diagnosis not present

## 2020-01-12 DIAGNOSIS — I480 Paroxysmal atrial fibrillation: Secondary | ICD-10-CM | POA: Diagnosis not present

## 2020-01-21 ENCOUNTER — Encounter: Payer: Self-pay | Admitting: Family Medicine

## 2020-01-21 NOTE — Telephone Encounter (Signed)
Sent for scan

## 2020-02-23 DIAGNOSIS — Z01812 Encounter for preprocedural laboratory examination: Secondary | ICD-10-CM | POA: Diagnosis not present

## 2020-02-23 DIAGNOSIS — E049 Nontoxic goiter, unspecified: Secondary | ICD-10-CM | POA: Diagnosis not present

## 2020-02-23 DIAGNOSIS — Z20822 Contact with and (suspected) exposure to covid-19: Secondary | ICD-10-CM | POA: Diagnosis not present

## 2020-02-29 DIAGNOSIS — E0500 Thyrotoxicosis with diffuse goiter without thyrotoxic crisis or storm: Secondary | ICD-10-CM | POA: Diagnosis not present

## 2020-02-29 DIAGNOSIS — E048 Other specified nontoxic goiter: Secondary | ICD-10-CM | POA: Diagnosis not present

## 2020-02-29 DIAGNOSIS — Z9889 Other specified postprocedural states: Secondary | ICD-10-CM | POA: Diagnosis not present

## 2020-02-29 DIAGNOSIS — E049 Nontoxic goiter, unspecified: Secondary | ICD-10-CM | POA: Diagnosis not present

## 2020-02-29 DIAGNOSIS — E890 Postprocedural hypothyroidism: Secondary | ICD-10-CM | POA: Diagnosis not present

## 2020-03-14 DIAGNOSIS — N951 Menopausal and female climacteric states: Secondary | ICD-10-CM | POA: Diagnosis not present

## 2020-03-14 DIAGNOSIS — I4891 Unspecified atrial fibrillation: Secondary | ICD-10-CM | POA: Diagnosis not present

## 2020-03-14 DIAGNOSIS — R5383 Other fatigue: Secondary | ICD-10-CM | POA: Diagnosis not present

## 2020-03-14 DIAGNOSIS — E2749 Other adrenocortical insufficiency: Secondary | ICD-10-CM | POA: Diagnosis not present

## 2020-03-14 DIAGNOSIS — E039 Hypothyroidism, unspecified: Secondary | ICD-10-CM | POA: Diagnosis not present

## 2020-03-14 DIAGNOSIS — A6920 Lyme disease, unspecified: Secondary | ICD-10-CM | POA: Diagnosis not present

## 2020-03-22 DIAGNOSIS — E0520 Thyrotoxicosis with toxic multinodular goiter without thyrotoxic crisis or storm: Secondary | ICD-10-CM | POA: Diagnosis not present

## 2020-03-22 DIAGNOSIS — R5383 Other fatigue: Secondary | ICD-10-CM | POA: Diagnosis not present

## 2020-03-22 DIAGNOSIS — R682 Dry mouth, unspecified: Secondary | ICD-10-CM | POA: Diagnosis not present

## 2020-05-09 IMAGING — DX LEFT FOOT - COMPLETE 3+ VIEW
3 series · 3 of 3 positions shown · non-contrast
Comparison: None.

CLINICAL DATA: LEFT foot and 5th toe pain following injury
yesterday. Initial encounter.

EXAM:
LEFT FOOT - COMPLETE 3+ VIEW

[foot ap]
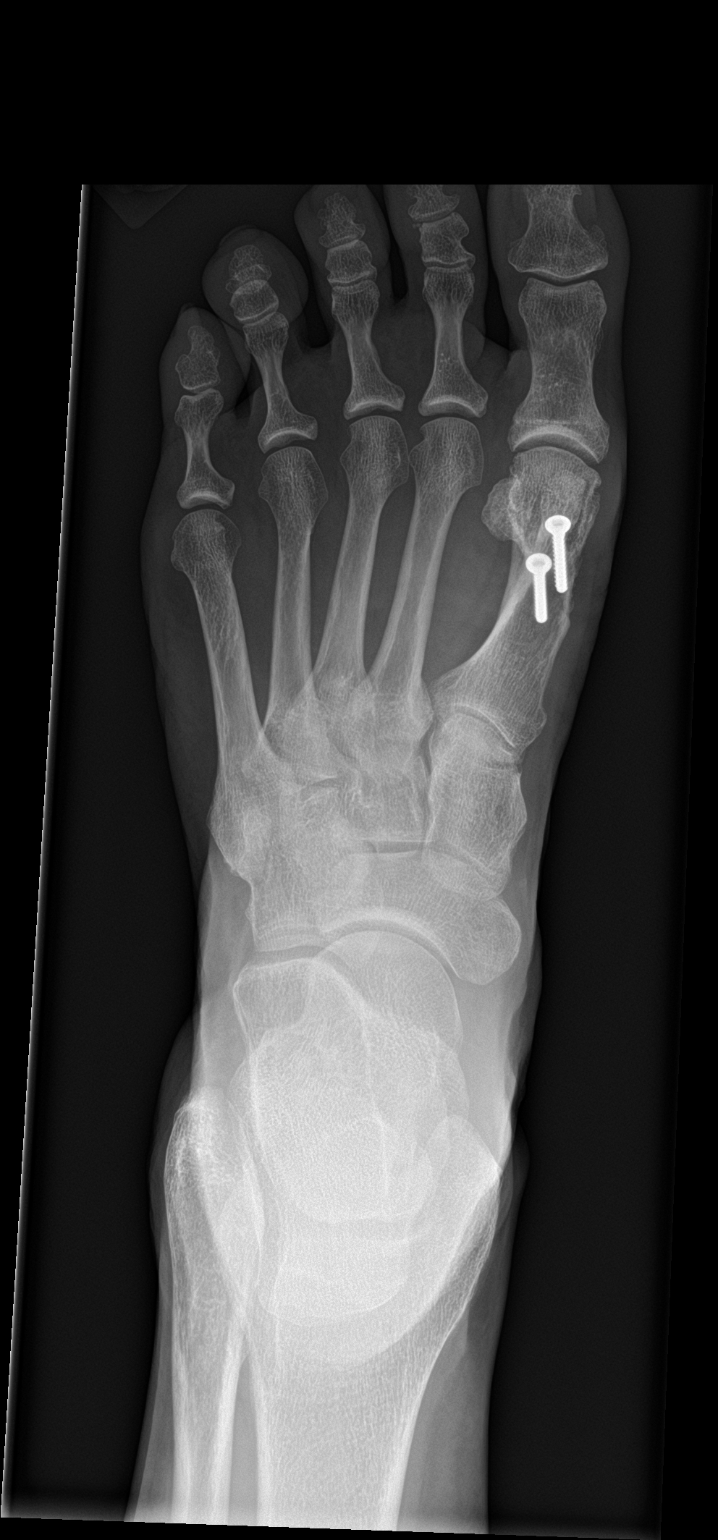

[foot obl]
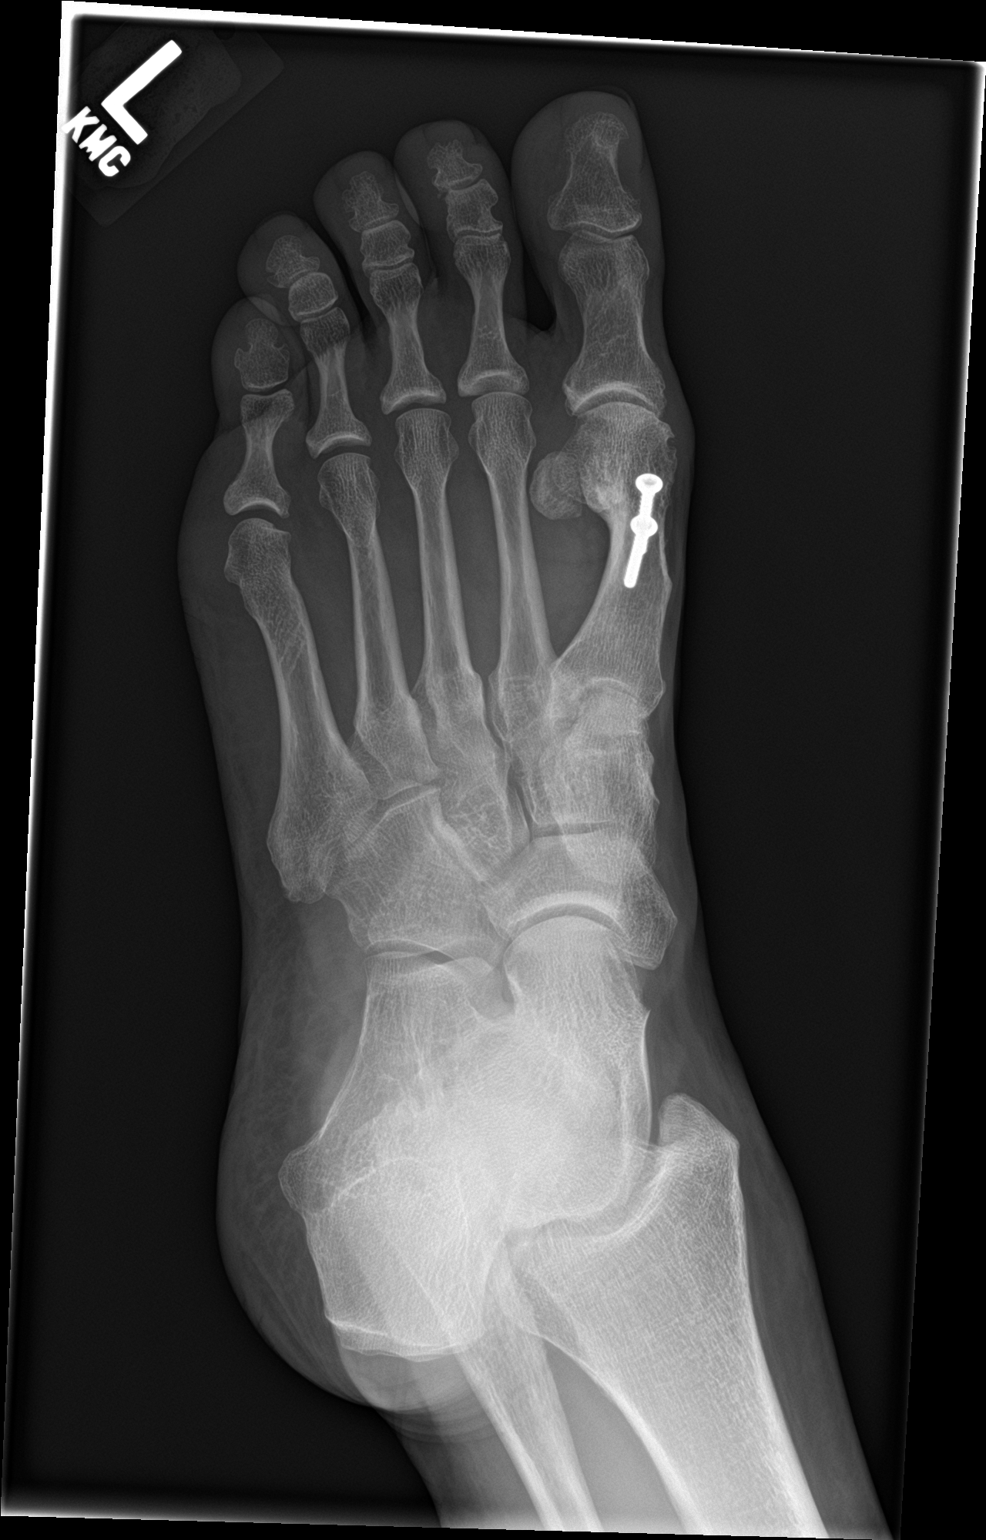

[foot lat]
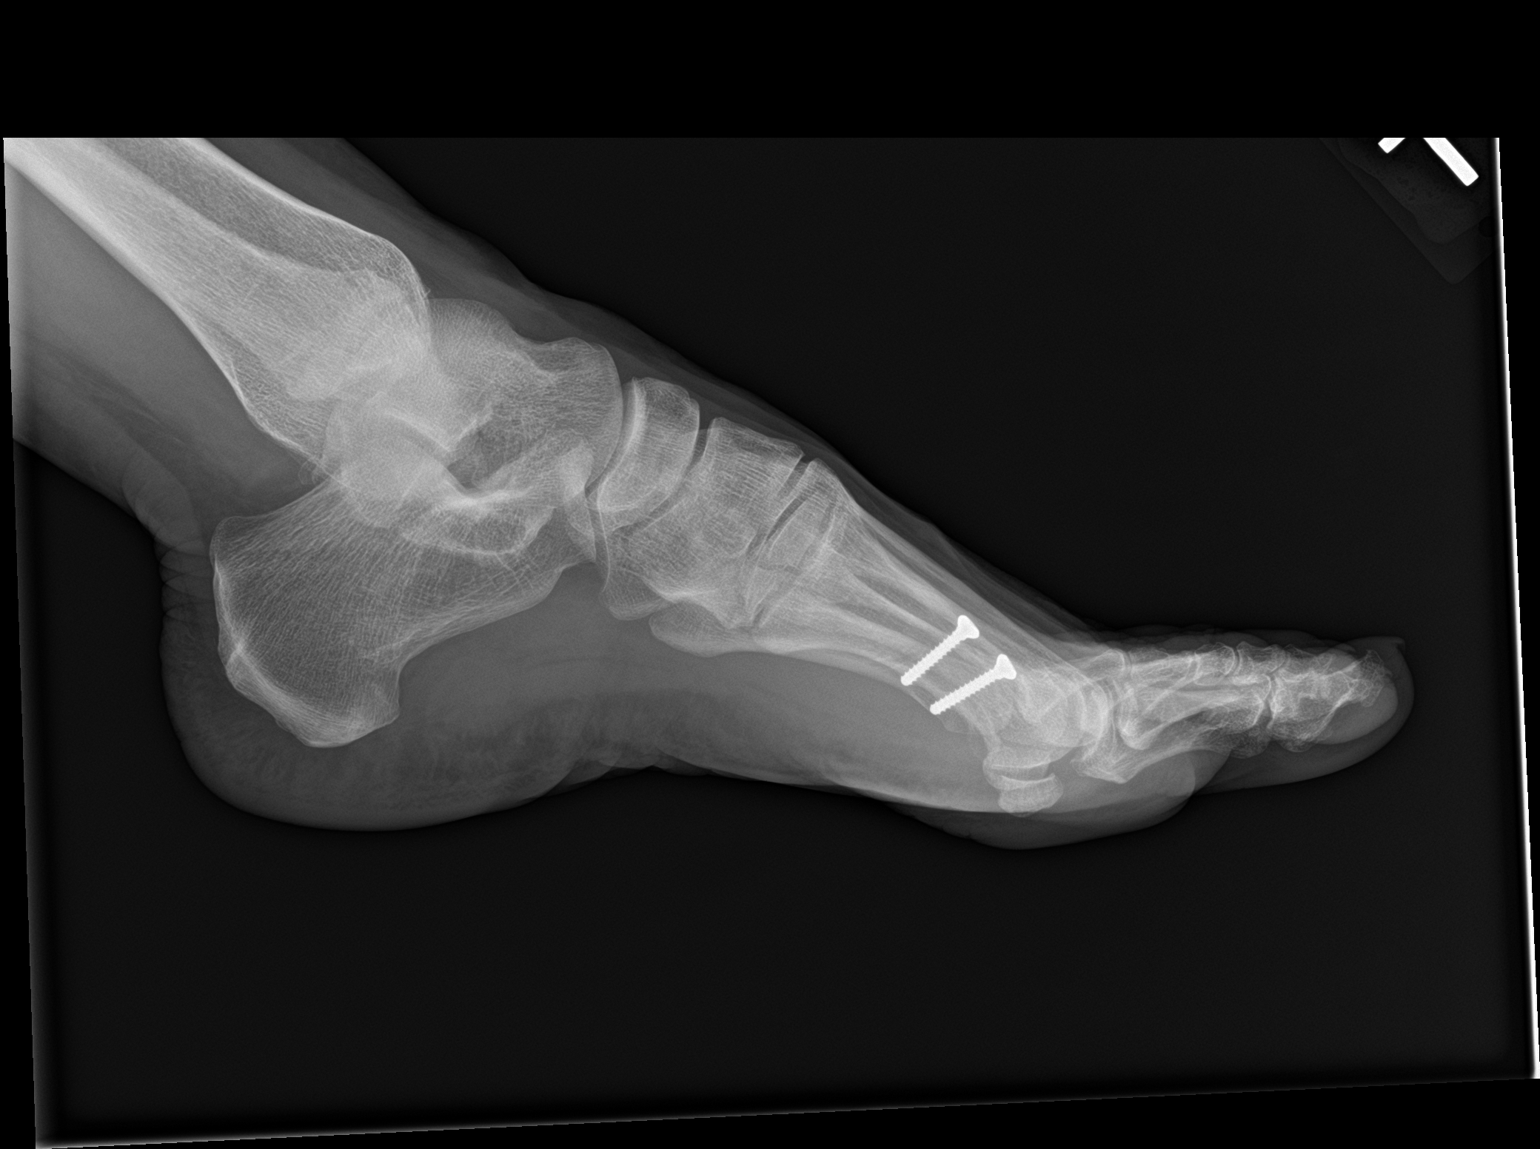

[3 of 3 positions shown; findings below may reference images not displayed]

FINDINGS: No acute fracture, subluxation or dislocation identified.

Remote fractures of the 1st metatarsal and surgical hardware noted.

No unexpected radiopaque foreign body.

No definite soft tissue swelling identified.
IMPRESSION: No evidence of acute bony abnormality.

## 2020-05-16 DIAGNOSIS — M545 Low back pain, unspecified: Secondary | ICD-10-CM | POA: Diagnosis not present

## 2020-05-17 DIAGNOSIS — M545 Low back pain, unspecified: Secondary | ICD-10-CM | POA: Diagnosis not present

## 2020-05-24 DIAGNOSIS — D3132 Benign neoplasm of left choroid: Secondary | ICD-10-CM | POA: Diagnosis not present

## 2020-05-25 DIAGNOSIS — M545 Low back pain, unspecified: Secondary | ICD-10-CM | POA: Diagnosis not present

## 2020-05-29 DIAGNOSIS — M545 Low back pain, unspecified: Secondary | ICD-10-CM | POA: Diagnosis not present

## 2020-05-31 DIAGNOSIS — M545 Low back pain, unspecified: Secondary | ICD-10-CM | POA: Diagnosis not present

## 2020-06-06 DIAGNOSIS — M545 Low back pain, unspecified: Secondary | ICD-10-CM | POA: Diagnosis not present

## 2020-06-12 DIAGNOSIS — M7522 Bicipital tendinitis, left shoulder: Secondary | ICD-10-CM | POA: Diagnosis not present

## 2020-06-12 DIAGNOSIS — M6281 Muscle weakness (generalized): Secondary | ICD-10-CM | POA: Diagnosis not present

## 2020-06-12 DIAGNOSIS — M545 Low back pain, unspecified: Secondary | ICD-10-CM | POA: Diagnosis not present

## 2020-06-12 DIAGNOSIS — M542 Cervicalgia: Secondary | ICD-10-CM | POA: Diagnosis not present

## 2020-06-13 DIAGNOSIS — M7522 Bicipital tendinitis, left shoulder: Secondary | ICD-10-CM | POA: Diagnosis not present

## 2020-06-13 DIAGNOSIS — M545 Low back pain, unspecified: Secondary | ICD-10-CM | POA: Diagnosis not present

## 2020-06-13 DIAGNOSIS — M542 Cervicalgia: Secondary | ICD-10-CM | POA: Diagnosis not present

## 2020-06-13 DIAGNOSIS — M6281 Muscle weakness (generalized): Secondary | ICD-10-CM | POA: Diagnosis not present

## 2020-06-15 DIAGNOSIS — M6281 Muscle weakness (generalized): Secondary | ICD-10-CM | POA: Diagnosis not present

## 2020-06-15 DIAGNOSIS — M7522 Bicipital tendinitis, left shoulder: Secondary | ICD-10-CM | POA: Diagnosis not present

## 2020-06-15 DIAGNOSIS — M545 Low back pain, unspecified: Secondary | ICD-10-CM | POA: Diagnosis not present

## 2020-06-15 DIAGNOSIS — M542 Cervicalgia: Secondary | ICD-10-CM | POA: Diagnosis not present

## 2020-06-22 DIAGNOSIS — M7522 Bicipital tendinitis, left shoulder: Secondary | ICD-10-CM | POA: Diagnosis not present

## 2020-06-22 DIAGNOSIS — M542 Cervicalgia: Secondary | ICD-10-CM | POA: Diagnosis not present

## 2020-06-22 DIAGNOSIS — M6281 Muscle weakness (generalized): Secondary | ICD-10-CM | POA: Diagnosis not present

## 2020-06-22 DIAGNOSIS — M545 Low back pain, unspecified: Secondary | ICD-10-CM | POA: Diagnosis not present

## 2020-06-23 DIAGNOSIS — M7522 Bicipital tendinitis, left shoulder: Secondary | ICD-10-CM | POA: Diagnosis not present

## 2020-06-23 DIAGNOSIS — M542 Cervicalgia: Secondary | ICD-10-CM | POA: Diagnosis not present

## 2020-06-23 DIAGNOSIS — M545 Low back pain, unspecified: Secondary | ICD-10-CM | POA: Diagnosis not present

## 2020-06-23 DIAGNOSIS — M6281 Muscle weakness (generalized): Secondary | ICD-10-CM | POA: Diagnosis not present

## 2020-06-27 DIAGNOSIS — M545 Low back pain, unspecified: Secondary | ICD-10-CM | POA: Diagnosis not present

## 2020-06-27 DIAGNOSIS — M542 Cervicalgia: Secondary | ICD-10-CM | POA: Diagnosis not present

## 2020-06-27 DIAGNOSIS — M7522 Bicipital tendinitis, left shoulder: Secondary | ICD-10-CM | POA: Diagnosis not present

## 2020-06-27 DIAGNOSIS — M6281 Muscle weakness (generalized): Secondary | ICD-10-CM | POA: Diagnosis not present

## 2020-07-03 DIAGNOSIS — M7522 Bicipital tendinitis, left shoulder: Secondary | ICD-10-CM | POA: Diagnosis not present

## 2020-07-03 DIAGNOSIS — M542 Cervicalgia: Secondary | ICD-10-CM | POA: Diagnosis not present

## 2020-07-03 DIAGNOSIS — M6281 Muscle weakness (generalized): Secondary | ICD-10-CM | POA: Diagnosis not present

## 2020-07-03 DIAGNOSIS — M545 Low back pain, unspecified: Secondary | ICD-10-CM | POA: Diagnosis not present

## 2020-07-04 DIAGNOSIS — M545 Low back pain, unspecified: Secondary | ICD-10-CM | POA: Diagnosis not present

## 2020-07-04 DIAGNOSIS — M542 Cervicalgia: Secondary | ICD-10-CM | POA: Diagnosis not present

## 2020-07-04 DIAGNOSIS — M6281 Muscle weakness (generalized): Secondary | ICD-10-CM | POA: Diagnosis not present

## 2020-07-04 DIAGNOSIS — M7522 Bicipital tendinitis, left shoulder: Secondary | ICD-10-CM | POA: Diagnosis not present

## 2020-07-10 DIAGNOSIS — M7522 Bicipital tendinitis, left shoulder: Secondary | ICD-10-CM | POA: Diagnosis not present

## 2020-07-10 DIAGNOSIS — M6281 Muscle weakness (generalized): Secondary | ICD-10-CM | POA: Diagnosis not present

## 2020-07-10 DIAGNOSIS — M545 Low back pain, unspecified: Secondary | ICD-10-CM | POA: Diagnosis not present

## 2020-07-10 DIAGNOSIS — M542 Cervicalgia: Secondary | ICD-10-CM | POA: Diagnosis not present

## 2020-07-12 DIAGNOSIS — M7522 Bicipital tendinitis, left shoulder: Secondary | ICD-10-CM | POA: Diagnosis not present

## 2020-07-12 DIAGNOSIS — I480 Paroxysmal atrial fibrillation: Secondary | ICD-10-CM | POA: Diagnosis not present

## 2020-07-12 DIAGNOSIS — M6281 Muscle weakness (generalized): Secondary | ICD-10-CM | POA: Diagnosis not present

## 2020-07-12 DIAGNOSIS — M545 Low back pain, unspecified: Secondary | ICD-10-CM | POA: Diagnosis not present

## 2020-07-12 DIAGNOSIS — M542 Cervicalgia: Secondary | ICD-10-CM | POA: Diagnosis not present

## 2020-07-13 DIAGNOSIS — H1131 Conjunctival hemorrhage, right eye: Secondary | ICD-10-CM | POA: Diagnosis not present

## 2020-07-18 DIAGNOSIS — M542 Cervicalgia: Secondary | ICD-10-CM | POA: Diagnosis not present

## 2020-07-18 DIAGNOSIS — M545 Low back pain, unspecified: Secondary | ICD-10-CM | POA: Diagnosis not present

## 2020-07-18 DIAGNOSIS — M6281 Muscle weakness (generalized): Secondary | ICD-10-CM | POA: Diagnosis not present

## 2020-07-18 DIAGNOSIS — M7522 Bicipital tendinitis, left shoulder: Secondary | ICD-10-CM | POA: Diagnosis not present

## 2020-07-25 DIAGNOSIS — M542 Cervicalgia: Secondary | ICD-10-CM | POA: Diagnosis not present

## 2020-07-25 DIAGNOSIS — M7522 Bicipital tendinitis, left shoulder: Secondary | ICD-10-CM | POA: Diagnosis not present

## 2020-07-25 DIAGNOSIS — M6281 Muscle weakness (generalized): Secondary | ICD-10-CM | POA: Diagnosis not present

## 2020-07-25 DIAGNOSIS — M545 Low back pain, unspecified: Secondary | ICD-10-CM | POA: Diagnosis not present

## 2020-08-23 DIAGNOSIS — L578 Other skin changes due to chronic exposure to nonionizing radiation: Secondary | ICD-10-CM | POA: Diagnosis not present

## 2020-08-23 DIAGNOSIS — L853 Xerosis cutis: Secondary | ICD-10-CM | POA: Diagnosis not present

## 2020-08-23 DIAGNOSIS — D225 Melanocytic nevi of trunk: Secondary | ICD-10-CM | POA: Diagnosis not present

## 2020-08-23 DIAGNOSIS — D485 Neoplasm of uncertain behavior of skin: Secondary | ICD-10-CM | POA: Diagnosis not present

## 2020-10-03 DIAGNOSIS — A6920 Lyme disease, unspecified: Secondary | ICD-10-CM | POA: Diagnosis not present

## 2020-10-03 DIAGNOSIS — N951 Menopausal and female climacteric states: Secondary | ICD-10-CM | POA: Diagnosis not present

## 2020-10-03 DIAGNOSIS — N899 Noninflammatory disorder of vagina, unspecified: Secondary | ICD-10-CM | POA: Diagnosis not present

## 2020-10-03 DIAGNOSIS — E782 Mixed hyperlipidemia: Secondary | ICD-10-CM | POA: Diagnosis not present

## 2020-10-03 DIAGNOSIS — I4891 Unspecified atrial fibrillation: Secondary | ICD-10-CM | POA: Diagnosis not present

## 2020-10-03 DIAGNOSIS — E559 Vitamin D deficiency, unspecified: Secondary | ICD-10-CM | POA: Diagnosis not present

## 2020-10-03 DIAGNOSIS — E538 Deficiency of other specified B group vitamins: Secondary | ICD-10-CM | POA: Diagnosis not present

## 2020-10-03 DIAGNOSIS — E039 Hypothyroidism, unspecified: Secondary | ICD-10-CM | POA: Diagnosis not present

## 2020-10-17 DIAGNOSIS — F32A Depression, unspecified: Secondary | ICD-10-CM | POA: Diagnosis not present

## 2020-10-17 DIAGNOSIS — H109 Unspecified conjunctivitis: Secondary | ICD-10-CM | POA: Diagnosis not present

## 2020-10-17 DIAGNOSIS — R5383 Other fatigue: Secondary | ICD-10-CM | POA: Diagnosis not present

## 2020-10-17 DIAGNOSIS — E2749 Other adrenocortical insufficiency: Secondary | ICD-10-CM | POA: Diagnosis not present

## 2020-11-27 DIAGNOSIS — E039 Hypothyroidism, unspecified: Secondary | ICD-10-CM | POA: Diagnosis not present

## 2020-11-29 ENCOUNTER — Encounter: Payer: Self-pay | Admitting: Family Medicine

## 2020-12-05 DIAGNOSIS — R945 Abnormal results of liver function studies: Secondary | ICD-10-CM | POA: Diagnosis not present

## 2020-12-17 NOTE — Patient Instructions (Addendum)
Good to see you again today. I will be in touch with your labs asap  Take care, I hope that you feel better soon   Health Maintenance, Female Adopting a healthy lifestyle and getting preventive care are important in promoting health and wellness. Ask your health care provider about:  The right schedule for you to have regular tests and exams.  Things you can do on your own to prevent diseases and keep yourself healthy. What should I know about diet, weight, and exercise? Eat a healthy diet  Eat a diet that includes plenty of vegetables, fruits, low-fat dairy products, and lean protein.  Do not eat a lot of foods that are high in solid fats, added sugars, or sodium.   Maintain a healthy weight Body mass index (BMI) is used to identify weight problems. It estimates body fat based on height and weight. Your health care provider can help determine your BMI and help you achieve or maintain a healthy weight. Get regular exercise Get regular exercise. This is one of the most important things you can do for your health. Most adults should:  Exercise for at least 150 minutes each week. The exercise should increase your heart rate and make you sweat (moderate-intensity exercise).  Do strengthening exercises at least twice a week. This is in addition to the moderate-intensity exercise.  Spend less time sitting. Even light physical activity can be beneficial. Watch cholesterol and blood lipids Have your blood tested for lipids and cholesterol at 64 years of age, then have this test every 5 years. Have your cholesterol levels checked more often if:  Your lipid or cholesterol levels are high.  You are older than 64 years of age.  You are at high risk for heart disease. What should I know about cancer screening? Depending on your health history and family history, you may need to have cancer screening at various ages. This may include screening for:  Breast cancer.  Cervical  cancer.  Colorectal cancer.  Skin cancer.  Lung cancer. What should I know about heart disease, diabetes, and high blood pressure? Blood pressure and heart disease  High blood pressure causes heart disease and increases the risk of stroke. This is more likely to develop in people who have high blood pressure readings, are of African descent, or are overweight.  Have your blood pressure checked: ? Every 3-5 years if you are 31-83 years of age. ? Every year if you are 46 years old or older. Diabetes Have regular diabetes screenings. This checks your fasting blood sugar level. Have the screening done:  Once every three years after age 33 if you are at a normal weight and have a low risk for diabetes.  More often and at a younger age if you are overweight or have a high risk for diabetes. What should I know about preventing infection? Hepatitis B If you have a higher risk for hepatitis B, you should be screened for this virus. Talk with your health care provider to find out if you are at risk for hepatitis B infection. Hepatitis C Testing is recommended for:  Everyone born from [birth year] through 1964.  Anyone with known risk factors for hepatitis C. Sexually transmitted infections (STIs)  Get screened for STIs, including gonorrhea and chlamydia, if: ? You are sexually active and are younger than 64 years of age. ? You are older than 64 years of age and your health care provider tells you that you are at risk for this type of  infection. ? Your sexual activity has changed since you were last screened, and you are at increased risk for chlamydia or gonorrhea. Ask your health care provider if you are at risk.  Ask your health care provider about whether you are at high risk for HIV. Your health care provider may recommend a prescription medicine to help prevent HIV infection. If you choose to take medicine to prevent HIV, you should first get tested for HIV. You should then be tested every 3  months for as long as you are taking the medicine. Pregnancy  If you are about to stop having your period (premenopausal) and you may become pregnant, seek counseling before you get pregnant.  Take 400 to 800 micrograms (mcg) of folic acid every day if you become pregnant.  Ask for birth control (contraception) if you want to prevent pregnancy. Osteoporosis and menopause Osteoporosis is a disease in which the bones lose minerals and strength with aging. This can result in bone fractures. If you are 58 years old or older, or if you are at risk for osteoporosis and fractures, ask your health care provider if you should:  Be screened for bone loss.  Take a calcium or vitamin D supplement to lower your risk of fractures.  Be given hormone replacement therapy (HRT) to treat symptoms of menopause. Follow these instructions at home: Lifestyle  Do not use any products that contain nicotine or tobacco, such as cigarettes, e-cigarettes, and chewing tobacco. If you need help quitting, ask your health care provider.  Do not use street drugs.  Do not share needles.  Ask your health care provider for help if you need support or information about quitting drugs. Alcohol use  Do not drink alcohol if: ? Your health care provider tells you not to drink. ? You are pregnant, may be pregnant, or are planning to become pregnant.  If you drink alcohol: ? Limit how much you use to 0-1 drink a day. ? Limit intake if you are breastfeeding.  Be aware of how much alcohol is in your drink. In the [place] one drink equals one 12 oz bottle of beer (355 mL), one 5 oz glass of wine (148 mL), or one 1 oz glass of hard liquor (44 mL). General instructions  Schedule regular health, dental, and eye exams.  Stay current with your vaccines.  Tell your health care provider if: ? You often feel depressed. ? You have ever been abused or do not feel safe at home. Summary  Adopting a healthy lifestyle and getting  preventive care are important in promoting health and wellness.  Follow your health care provider's instructions about healthy diet, exercising, and getting tested or screened for diseases.  Follow your health care provider's instructions on monitoring your cholesterol and blood pressure. This information is not intended to replace advice given to you by your health care provider. Make sure you discuss any questions you have with your health care provider. Document Revised: 07/15/2018 Document Reviewed: 07/15/2018 Elsevier Patient Education  2021 [employer].

## 2020-12-17 NOTE — Progress Notes (Addendum)
[facility] [address] [place], [place] [address] [phone]  Date:  12/21/2020   Name:  Christy Hill   DOB:  July 21, 1956   MRN:  [id number]  PCP:  Darreld Mclean, MD    Chief Complaint: Annual Exam   History of Present Illness:  Christy Hill is a 65 y.o. very pleasant female patient who presents with the following:  Visit today for a CPE Last seen by myself 2 years ago   She came down with covid 3x - most recently she got sick with olmicron in January of this year,  She now seems to have "long covid"- she is working with a doctor in [place] who has her using an HIV drug and a statin   History of atrial fib, hyperthyroidism/ Graves disease s/p thyroidectomy.  No longer taking flecainide Her cardiologist is with [facility]- they talked about doing an ablation last year but she did not end up doing this She can tell when she is in a fib "sometimes"  Seen by cardiology 2 days ago -  Assessment  1. PAF (paroxysmal atrial fibrillation) (*)- s/p PVI 10-2019   Paroxysmal atrial fibrillation --> strong family history AFib --> first diagnosed 2017, started flecainide Dec 2017 --> ambulatory monitor Apr 2019:confirmed paroxysmal AFib --> EKG Jun 2019: confounded a bit by IVCD and also on flecainide - on close review, probably just AFib --> rate control difficult due to baseline hypotension - digoxin didn't help --> intolerant to digoxin, metoprolol, bisoprolol, and verapamiland diltiazem --> Cryo PVI Mar 2021 --> stopped eliquis Jun 2021  Cardiac imaging --> echo Apr 2019: LVEF 55%; mild MR/TR; small pericardial effusion --> TEE Mar 2021 while AF/RVR: LVEF mildly reduced; mild LAE; mild MR/TR --> echo Jun 2021 in [facility]: LVEF 50%; mild-mod TR; trace MR  Hypothyroid --> post thyroidectomyDec 2018 - noncancerous --> normal thyroid studies (while on armour thyroid) Oct 2019, so thyroid status is not the cause of  her AFib  Possible chronic Lyme disease --> notconfirmed, so unclear  COVID  --> (?) possibly tested positive for the antibodies in 2020 - I'm not sure about this --> is currently (2022) being treated for "long COVID" with several meds, including maraviroc    She has a contracture on her right hand- this is not severe enough for her to want to see hand surgery as of yet, she is able to do her activities as desired   covid series Pap- s/p hyst cervix removed  Tetanus- pt declines  mammo- she is doing "thermography" for screening  Labs are due  shingirx - she does not wish to do this  cologuard 2020  She is still exercising on a regular basis  Never a smoker   Patient Active Problem List   Diagnosis Date Noted  . Dysmenorrhea 12/13/2014  . Graves' ophthalmopathy 09/14/2013  . Synovitis of finger 09/14/2013  . Thyroid eye disease 04/05/2013  . Vaginal spotting 12/10/2012  . Hyperthyroidism 06/22/2012  . History of fibromyalgia 06/21/2012  . Allergic rhinitis 06/21/2012  . Menopause 06/11/2012  . History of migraine headaches 06/11/2012  . Depression 06/11/2012  . S/P cholecystectomy 06/11/2012  . S/P exploratory laparotomy 06/11/2012  . New onset atrial fibrillation ([facility]) 06/11/2012    Past Medical History:  Diagnosis Date  . Adverse effect of general anesthetic    pt has graves disase and needs eye ointment to keeps lubricated  . Anemia  currently taking iron  . Asthma    no meds, years no problems  . Atrial fibrillation ([facility])    secondary to thyroid  . Fibromyalgia   . Hyperthyroidism   . Migraines    history  . PONV (postoperative nausea and vomiting)    after all surgeries including colonoscopy  . Thyroid disease     Past Surgical History:  Procedure Laterality Date  . APPENDECTOMY    . BILATERAL SALPINGECTOMY Bilateral 12/13/2014   Procedure: BILATERAL SALPINGECTOMY;  Surgeon: Brien Few, MD;  Location: [facility];  Service: Gynecology;   Laterality: Bilateral;  . BREAST ENHANCEMENT SURGERY    . BREAST IMPLANT REMOVAL    . BUNIONECTOMY    . CESAREAN SECTION    . CHOLECYSTECTOMY    . COLONOSCOPY    . DILATATION & CURRETTAGE/HYSTEROSCOPY WITH RESECTOCOPE N/A 03/31/2013   Procedure: DILATATION & CURETTAGE/HYSTEROSCOPY WITH RESECTOCOPE;  Surgeon: Linda Hedges, DO;  Location: [facility];  Service: Gynecology;  Laterality: N/A;  . ENDOMETRIAL ABLATION    . KNEE ARTHROSCOPY    . ROBOTIC ASSISTED TOTAL HYSTERECTOMY N/A 12/13/2014   Procedure: ROBOTIC ASSISTED TOTAL HYSTERECTOMY;  Surgeon: Brien Few, MD;  Location: [facility];  Service: Gynecology;  Laterality: N/A;  2 1/2 hrs.    Social History   Tobacco Use  . Smoking status: Never Smoker  . Smokeless tobacco: Never Used  Substance Use Topics  . Alcohol use: Yes    Alcohol/week: 1.0 - 2.0 standard drink    Types: 1 - 2 Standard drinks or equivalent per week    Comment: HAD ETOH ON TUESDAY  . Drug use: No    Family History  Problem Relation Age of Onset  . Depression Mother   . Dementia Mother   . Cancer Father   . Dementia Father   . Depression Sister   . Depression Brother   . Pernicious anemia Paternal Aunt   . Pernicious anemia Paternal Grandmother   . Alcohol abuse Neg Hx   . Bipolar disorder Son     Allergies  Allergen Reactions  . Influenza Vaccines Other (See Comments)    AFIB  . Gluten Meal   . Lac Bovis Nausea Only    Cow milk only  . Molds & Smuts Other (See Comments)    Sick to stomach - headache  . Pollen Extract Other (See Comments)    sneezing  . Wheat Bran Other (See Comments)    Medication list has been reviewed and updated.  Current Outpatient Medications on File Prior to Visit  Medication Sig Dispense Refill  . Ascorbic Acid (VITAMIN C) 1000 MG tablet Take 1,000 mg by mouth 3 (three) times daily.    Marland Kitchen b complex vitamins tablet Take 1 tablet by mouth daily.    . Cholecalciferol (VITAMIN D3) 5000 UNITS CAPS Take 1 capsule by mouth  daily.    Marland Kitchen estradiol (CLIMARA - DOSED IN MG/24 HR) 0.05 mg/24hr patch Place 0.05 mg onto the skin once a week.    . Multiple Vitamins-Minerals (ZINC PO) Take 1 tablet by mouth daily. With copper    . NALTREXONE HCL PO Take 4.5 mg by mouth daily. For auto immune issues    . NATURE-THROID 130 MG tablet 65 mg.    . Nutritional Supplements (DHEA PO) Take 100 mg by mouth daily.    . Omega-3 Fatty Acids (FISH OIL) 1000 MG CAPS Take 1 capsule by mouth 3 (three) times daily.    . Probiotic Product (PROBIOTIC PO) Take  1 capsule by mouth daily.    . progesterone (PROMETRIUM) 100 MG capsule Take 400 mg by mouth at bedtime.    . Testosterone 10 MG/ACT (2%) GEL Place 0.25-0.5 mLs onto the skin at bedtime. Apply behind the knee    . VITAMIN K PO Take 900 mcg by mouth daily.     No current facility-administered medications on file prior to visit.    Review of Systems:  As per HPI- otherwise negative.   Physical Examination: Vitals:   12/21/20 0817  BP: 110/64  Pulse: 71  Resp: 16  Temp: (!) 97.3 F (36.3 C)  SpO2: 98%   Vitals:   12/21/20 0817  Weight: 139 lb (63 kg)  Height: 5\' 7"  (1.702 m)   Body mass index is 21.77 kg/m. Ideal Body Weight: Weight in (lb) to have BMI = 25: 159.3  GEN: no acute distress.  Normal weight, looks well  HEENT: Atraumatic, Normocephalic.   Bilateral TM wnl, oropharynx normal.  PEERL,EOMI.   Ears and Nose: No external deformity. CV: RRR, No M/G/R. No JVD. No thrill. No extra heart sounds. PULM: CTA B, no wheezes, crackles, rhonchi. No retractions. No resp. distress. No accessory muscle use. ABD: S, NT, ND, +BS. No rebound. No HSM. EXTR: No c/c/e PSYCH: Normally interactive. Conversant.    Assessment and Plan: Physical exam  Hyperthyroidism  Chronic atrial fibrillation ([facility])  Screening for hyperlipidemia - Plan: Lipid panel  Screening for diabetes mellitus - Plan: Hemoglobin A1c  Screening, deficiency anemia, iron  Fatigue, unspecified  type    Physical exam today- encouraged healthy diet and exercise routine Will plan further follow- up pending labs. Declines routine immunizations Advised pt that thermography is not considered to be adequate breast ca screening by most authorities and I would still recommend mammography   This visit occurred during the SARS-CoV-2 public health emergency.  Safety protocols were in place, including screening questions prior to the visit, additional usage of staff PPE, and extensive cleaning of exam room while observing appropriate contact time as indicated for disinfecting solutions.    Signed Lamar Blinks, MD  Received labs as below, message to patient  Results for orders placed or performed in visit on 12/21/20  Hemoglobin A1c  Result Value Ref Range   Hgb A1c MFr Bld 5.4 4.6 - 6.5 %  Lipid panel  Result Value Ref Range   Cholesterol 163 0 - 200 mg/dL   Triglycerides 53.0 0.0 - 149.0 mg/dL   HDL 74.50 >39.00 mg/dL   VLDL 10.6 0.0 - 40.0 mg/dL   LDL Cholesterol 78 0 - 99 mg/dL   Total CHOL/HDL Ratio 2    NonHDL 88.38

## 2020-12-19 DIAGNOSIS — I480 Paroxysmal atrial fibrillation: Secondary | ICD-10-CM | POA: Diagnosis not present

## 2020-12-21 ENCOUNTER — Ambulatory Visit (INDEPENDENT_AMBULATORY_CARE_PROVIDER_SITE_OTHER): Payer: BC Managed Care – PPO | Admitting: Family Medicine

## 2020-12-21 ENCOUNTER — Encounter: Payer: Self-pay | Admitting: Family Medicine

## 2020-12-21 ENCOUNTER — Other Ambulatory Visit: Payer: Self-pay

## 2020-12-21 VITALS — BP 110/64 | HR 71 | Temp 97.3°F | Resp 16 | Ht 67.0 in | Wt 139.0 lb

## 2020-12-21 DIAGNOSIS — I482 Chronic atrial fibrillation, unspecified: Secondary | ICD-10-CM

## 2020-12-21 DIAGNOSIS — E0590 Thyrotoxicosis, unspecified without thyrotoxic crisis or storm: Secondary | ICD-10-CM

## 2020-12-21 DIAGNOSIS — Z0000 Encounter for general adult medical examination without abnormal findings: Secondary | ICD-10-CM | POA: Diagnosis not present

## 2020-12-21 DIAGNOSIS — R5383 Other fatigue: Secondary | ICD-10-CM

## 2020-12-21 DIAGNOSIS — Z13220 Encounter for screening for lipoid disorders: Secondary | ICD-10-CM

## 2020-12-21 DIAGNOSIS — Z131 Encounter for screening for diabetes mellitus: Secondary | ICD-10-CM

## 2020-12-21 DIAGNOSIS — Z130 Encounter for screening for diseases of the blood and blood-forming organs and certain disorders involving the immune mechanism: Secondary | ICD-10-CM

## 2020-12-21 LAB — LIPID PANEL
Cholesterol: 163 mg/dL (ref 0–200)
HDL: 74.5 mg/dL (ref >39.00)
LDL Cholesterol: 78 mg/dL (ref 0–99)
NonHDL: 88.38
Total CHOL/HDL Ratio: 2
Triglycerides: 53 mg/dL (ref 0.0–149.0)
VLDL: 10.6 mg/dL (ref 0.0–40.0)

## 2020-12-21 LAB — HEMOGLOBIN A1C: Hgb A1c MFr Bld: 5.4 % (ref 4.6–6.5)

## 2020-12-25 DIAGNOSIS — M720 Palmar fascial fibromatosis [Dupuytren]: Secondary | ICD-10-CM | POA: Diagnosis not present

## 2020-12-25 DIAGNOSIS — U099 Post covid-19 condition, unspecified: Secondary | ICD-10-CM | POA: Diagnosis not present

## 2020-12-25 DIAGNOSIS — R5381 Other malaise: Secondary | ICD-10-CM | POA: Diagnosis not present

## 2021-04-11 DIAGNOSIS — R5383 Other fatigue: Secondary | ICD-10-CM | POA: Diagnosis not present

## 2021-04-11 DIAGNOSIS — U099 Post covid-19 condition, unspecified: Secondary | ICD-10-CM | POA: Diagnosis not present

## 2021-04-11 DIAGNOSIS — M791 Myalgia, unspecified site: Secondary | ICD-10-CM | POA: Diagnosis not present

## 2021-05-29 DIAGNOSIS — L821 Other seborrheic keratosis: Secondary | ICD-10-CM | POA: Diagnosis not present

## 2021-05-29 DIAGNOSIS — L810 Postinflammatory hyperpigmentation: Secondary | ICD-10-CM | POA: Diagnosis not present

## 2021-06-01 DIAGNOSIS — M25561 Pain in right knee: Secondary | ICD-10-CM | POA: Diagnosis not present

## 2021-06-11 DIAGNOSIS — N951 Menopausal and female climacteric states: Secondary | ICD-10-CM | POA: Diagnosis not present

## 2021-06-11 DIAGNOSIS — E538 Deficiency of other specified B group vitamins: Secondary | ICD-10-CM | POA: Diagnosis not present

## 2021-06-11 DIAGNOSIS — E039 Hypothyroidism, unspecified: Secondary | ICD-10-CM | POA: Diagnosis not present

## 2021-06-11 DIAGNOSIS — I4891 Unspecified atrial fibrillation: Secondary | ICD-10-CM | POA: Diagnosis not present

## 2021-06-11 DIAGNOSIS — Z131 Encounter for screening for diabetes mellitus: Secondary | ICD-10-CM | POA: Diagnosis not present

## 2021-06-11 DIAGNOSIS — U099 Post covid-19 condition, unspecified: Secondary | ICD-10-CM | POA: Diagnosis not present

## 2021-06-11 DIAGNOSIS — M720 Palmar fascial fibromatosis [Dupuytren]: Secondary | ICD-10-CM | POA: Diagnosis not present

## 2021-06-26 DIAGNOSIS — M25561 Pain in right knee: Secondary | ICD-10-CM | POA: Diagnosis not present

## 2021-07-03 DIAGNOSIS — M25561 Pain in right knee: Secondary | ICD-10-CM | POA: Diagnosis not present

## 2021-07-13 DIAGNOSIS — M25561 Pain in right knee: Secondary | ICD-10-CM | POA: Diagnosis not present

## 2021-07-18 DIAGNOSIS — M79641 Pain in right hand: Secondary | ICD-10-CM | POA: Diagnosis not present

## 2021-07-18 DIAGNOSIS — M25561 Pain in right knee: Secondary | ICD-10-CM | POA: Diagnosis not present

## 2021-08-10 DIAGNOSIS — M25561 Pain in right knee: Secondary | ICD-10-CM | POA: Diagnosis not present

## 2021-08-10 DIAGNOSIS — R29898 Other symptoms and signs involving the musculoskeletal system: Secondary | ICD-10-CM | POA: Diagnosis not present

## 2021-08-10 DIAGNOSIS — M25661 Stiffness of right knee, not elsewhere classified: Secondary | ICD-10-CM | POA: Diagnosis not present

## 2021-08-14 DIAGNOSIS — R29898 Other symptoms and signs involving the musculoskeletal system: Secondary | ICD-10-CM | POA: Diagnosis not present

## 2021-08-14 DIAGNOSIS — M25661 Stiffness of right knee, not elsewhere classified: Secondary | ICD-10-CM | POA: Diagnosis not present

## 2021-08-14 DIAGNOSIS — M25561 Pain in right knee: Secondary | ICD-10-CM | POA: Diagnosis not present

## 2021-08-17 DIAGNOSIS — M25561 Pain in right knee: Secondary | ICD-10-CM | POA: Diagnosis not present

## 2021-08-22 DIAGNOSIS — M25661 Stiffness of right knee, not elsewhere classified: Secondary | ICD-10-CM | POA: Diagnosis not present

## 2021-08-22 DIAGNOSIS — M25561 Pain in right knee: Secondary | ICD-10-CM | POA: Diagnosis not present

## 2021-08-22 DIAGNOSIS — R29898 Other symptoms and signs involving the musculoskeletal system: Secondary | ICD-10-CM | POA: Diagnosis not present

## 2021-08-24 DIAGNOSIS — R29898 Other symptoms and signs involving the musculoskeletal system: Secondary | ICD-10-CM | POA: Diagnosis not present

## 2021-08-24 DIAGNOSIS — M25661 Stiffness of right knee, not elsewhere classified: Secondary | ICD-10-CM | POA: Diagnosis not present

## 2021-08-24 DIAGNOSIS — M25561 Pain in right knee: Secondary | ICD-10-CM | POA: Diagnosis not present

## 2021-08-28 DIAGNOSIS — M25561 Pain in right knee: Secondary | ICD-10-CM | POA: Diagnosis not present

## 2021-09-04 DIAGNOSIS — M25561 Pain in right knee: Secondary | ICD-10-CM | POA: Diagnosis not present

## 2021-09-06 DIAGNOSIS — M25561 Pain in right knee: Secondary | ICD-10-CM | POA: Diagnosis not present

## 2021-09-06 DIAGNOSIS — R29898 Other symptoms and signs involving the musculoskeletal system: Secondary | ICD-10-CM | POA: Diagnosis not present

## 2021-09-06 DIAGNOSIS — M25661 Stiffness of right knee, not elsewhere classified: Secondary | ICD-10-CM | POA: Diagnosis not present

## 2021-09-11 DIAGNOSIS — R29898 Other symptoms and signs involving the musculoskeletal system: Secondary | ICD-10-CM | POA: Diagnosis not present

## 2021-09-11 DIAGNOSIS — M25561 Pain in right knee: Secondary | ICD-10-CM | POA: Diagnosis not present

## 2021-09-11 DIAGNOSIS — M25661 Stiffness of right knee, not elsewhere classified: Secondary | ICD-10-CM | POA: Diagnosis not present

## 2021-09-14 DIAGNOSIS — S83242A Other tear of medial meniscus, current injury, left knee, initial encounter: Secondary | ICD-10-CM | POA: Diagnosis not present

## 2021-09-18 DIAGNOSIS — M25561 Pain in right knee: Secondary | ICD-10-CM | POA: Diagnosis not present

## 2021-09-18 DIAGNOSIS — M25661 Stiffness of right knee, not elsewhere classified: Secondary | ICD-10-CM | POA: Diagnosis not present

## 2021-09-18 DIAGNOSIS — R29898 Other symptoms and signs involving the musculoskeletal system: Secondary | ICD-10-CM | POA: Diagnosis not present

## 2021-09-20 DIAGNOSIS — M25561 Pain in right knee: Secondary | ICD-10-CM | POA: Diagnosis not present

## 2021-09-20 DIAGNOSIS — R29898 Other symptoms and signs involving the musculoskeletal system: Secondary | ICD-10-CM | POA: Diagnosis not present

## 2021-09-20 DIAGNOSIS — M25661 Stiffness of right knee, not elsewhere classified: Secondary | ICD-10-CM | POA: Diagnosis not present

## 2021-09-27 DIAGNOSIS — M25561 Pain in right knee: Secondary | ICD-10-CM | POA: Diagnosis not present

## 2021-10-02 DIAGNOSIS — M25651 Stiffness of right hip, not elsewhere classified: Secondary | ICD-10-CM | POA: Diagnosis not present

## 2021-10-02 DIAGNOSIS — M25561 Pain in right knee: Secondary | ICD-10-CM | POA: Diagnosis not present

## 2021-10-02 DIAGNOSIS — R29898 Other symptoms and signs involving the musculoskeletal system: Secondary | ICD-10-CM | POA: Diagnosis not present

## 2021-12-17 DIAGNOSIS — M9903 Segmental and somatic dysfunction of lumbar region: Secondary | ICD-10-CM | POA: Diagnosis not present

## 2021-12-17 DIAGNOSIS — M9901 Segmental and somatic dysfunction of cervical region: Secondary | ICD-10-CM | POA: Diagnosis not present

## 2021-12-17 DIAGNOSIS — M9902 Segmental and somatic dysfunction of thoracic region: Secondary | ICD-10-CM | POA: Diagnosis not present

## 2021-12-17 DIAGNOSIS — M9905 Segmental and somatic dysfunction of pelvic region: Secondary | ICD-10-CM | POA: Diagnosis not present

## 2021-12-18 DIAGNOSIS — M9903 Segmental and somatic dysfunction of lumbar region: Secondary | ICD-10-CM | POA: Diagnosis not present

## 2021-12-18 DIAGNOSIS — M9902 Segmental and somatic dysfunction of thoracic region: Secondary | ICD-10-CM | POA: Diagnosis not present

## 2021-12-18 DIAGNOSIS — M9901 Segmental and somatic dysfunction of cervical region: Secondary | ICD-10-CM | POA: Diagnosis not present

## 2021-12-18 DIAGNOSIS — M9905 Segmental and somatic dysfunction of pelvic region: Secondary | ICD-10-CM | POA: Diagnosis not present

## 2021-12-20 DIAGNOSIS — M9905 Segmental and somatic dysfunction of pelvic region: Secondary | ICD-10-CM | POA: Diagnosis not present

## 2021-12-20 DIAGNOSIS — M9901 Segmental and somatic dysfunction of cervical region: Secondary | ICD-10-CM | POA: Diagnosis not present

## 2021-12-20 DIAGNOSIS — M9903 Segmental and somatic dysfunction of lumbar region: Secondary | ICD-10-CM | POA: Diagnosis not present

## 2021-12-20 DIAGNOSIS — M9902 Segmental and somatic dysfunction of thoracic region: Secondary | ICD-10-CM | POA: Diagnosis not present

## 2021-12-25 DIAGNOSIS — M9903 Segmental and somatic dysfunction of lumbar region: Secondary | ICD-10-CM | POA: Diagnosis not present

## 2021-12-25 DIAGNOSIS — M9901 Segmental and somatic dysfunction of cervical region: Secondary | ICD-10-CM | POA: Diagnosis not present

## 2021-12-25 DIAGNOSIS — M9902 Segmental and somatic dysfunction of thoracic region: Secondary | ICD-10-CM | POA: Diagnosis not present

## 2021-12-25 DIAGNOSIS — M9905 Segmental and somatic dysfunction of pelvic region: Secondary | ICD-10-CM | POA: Diagnosis not present

## 2021-12-27 DIAGNOSIS — M9905 Segmental and somatic dysfunction of pelvic region: Secondary | ICD-10-CM | POA: Diagnosis not present

## 2021-12-27 DIAGNOSIS — M9902 Segmental and somatic dysfunction of thoracic region: Secondary | ICD-10-CM | POA: Diagnosis not present

## 2021-12-27 DIAGNOSIS — M9903 Segmental and somatic dysfunction of lumbar region: Secondary | ICD-10-CM | POA: Diagnosis not present

## 2021-12-27 DIAGNOSIS — M9901 Segmental and somatic dysfunction of cervical region: Secondary | ICD-10-CM | POA: Diagnosis not present

## 2022-01-01 DIAGNOSIS — M9905 Segmental and somatic dysfunction of pelvic region: Secondary | ICD-10-CM | POA: Diagnosis not present

## 2022-01-01 DIAGNOSIS — M9902 Segmental and somatic dysfunction of thoracic region: Secondary | ICD-10-CM | POA: Diagnosis not present

## 2022-01-01 DIAGNOSIS — M9901 Segmental and somatic dysfunction of cervical region: Secondary | ICD-10-CM | POA: Diagnosis not present

## 2022-01-01 DIAGNOSIS — M9903 Segmental and somatic dysfunction of lumbar region: Secondary | ICD-10-CM | POA: Diagnosis not present

## 2022-01-03 DIAGNOSIS — M9901 Segmental and somatic dysfunction of cervical region: Secondary | ICD-10-CM | POA: Diagnosis not present

## 2022-01-03 DIAGNOSIS — M9903 Segmental and somatic dysfunction of lumbar region: Secondary | ICD-10-CM | POA: Diagnosis not present

## 2022-01-03 DIAGNOSIS — M9902 Segmental and somatic dysfunction of thoracic region: Secondary | ICD-10-CM | POA: Diagnosis not present

## 2022-01-03 DIAGNOSIS — M9905 Segmental and somatic dysfunction of pelvic region: Secondary | ICD-10-CM | POA: Diagnosis not present

## 2022-01-07 DIAGNOSIS — N951 Menopausal and female climacteric states: Secondary | ICD-10-CM | POA: Diagnosis not present

## 2022-01-07 DIAGNOSIS — E039 Hypothyroidism, unspecified: Secondary | ICD-10-CM | POA: Diagnosis not present

## 2022-01-07 DIAGNOSIS — E538 Deficiency of other specified B group vitamins: Secondary | ICD-10-CM | POA: Diagnosis not present

## 2022-01-07 DIAGNOSIS — E559 Vitamin D deficiency, unspecified: Secondary | ICD-10-CM | POA: Diagnosis not present

## 2022-01-08 DIAGNOSIS — M9903 Segmental and somatic dysfunction of lumbar region: Secondary | ICD-10-CM | POA: Diagnosis not present

## 2022-01-08 DIAGNOSIS — M9901 Segmental and somatic dysfunction of cervical region: Secondary | ICD-10-CM | POA: Diagnosis not present

## 2022-01-08 DIAGNOSIS — M9905 Segmental and somatic dysfunction of pelvic region: Secondary | ICD-10-CM | POA: Diagnosis not present

## 2022-01-08 DIAGNOSIS — M9902 Segmental and somatic dysfunction of thoracic region: Secondary | ICD-10-CM | POA: Diagnosis not present

## 2022-01-10 DIAGNOSIS — M9902 Segmental and somatic dysfunction of thoracic region: Secondary | ICD-10-CM | POA: Diagnosis not present

## 2022-01-10 DIAGNOSIS — M9903 Segmental and somatic dysfunction of lumbar region: Secondary | ICD-10-CM | POA: Diagnosis not present

## 2022-01-10 DIAGNOSIS — M9905 Segmental and somatic dysfunction of pelvic region: Secondary | ICD-10-CM | POA: Diagnosis not present

## 2022-01-10 DIAGNOSIS — M9901 Segmental and somatic dysfunction of cervical region: Secondary | ICD-10-CM | POA: Diagnosis not present

## 2022-01-14 DIAGNOSIS — M9905 Segmental and somatic dysfunction of pelvic region: Secondary | ICD-10-CM | POA: Diagnosis not present

## 2022-01-14 DIAGNOSIS — M9901 Segmental and somatic dysfunction of cervical region: Secondary | ICD-10-CM | POA: Diagnosis not present

## 2022-01-14 DIAGNOSIS — M9903 Segmental and somatic dysfunction of lumbar region: Secondary | ICD-10-CM | POA: Diagnosis not present

## 2022-01-14 DIAGNOSIS — M9902 Segmental and somatic dysfunction of thoracic region: Secondary | ICD-10-CM | POA: Diagnosis not present

## 2022-01-16 DIAGNOSIS — M9902 Segmental and somatic dysfunction of thoracic region: Secondary | ICD-10-CM | POA: Diagnosis not present

## 2022-01-16 DIAGNOSIS — M9903 Segmental and somatic dysfunction of lumbar region: Secondary | ICD-10-CM | POA: Diagnosis not present

## 2022-01-16 DIAGNOSIS — M9905 Segmental and somatic dysfunction of pelvic region: Secondary | ICD-10-CM | POA: Diagnosis not present

## 2022-01-16 DIAGNOSIS — M9901 Segmental and somatic dysfunction of cervical region: Secondary | ICD-10-CM | POA: Diagnosis not present

## 2022-01-28 DIAGNOSIS — M9902 Segmental and somatic dysfunction of thoracic region: Secondary | ICD-10-CM | POA: Diagnosis not present

## 2022-01-28 DIAGNOSIS — M9903 Segmental and somatic dysfunction of lumbar region: Secondary | ICD-10-CM | POA: Diagnosis not present

## 2022-01-28 DIAGNOSIS — M9905 Segmental and somatic dysfunction of pelvic region: Secondary | ICD-10-CM | POA: Diagnosis not present

## 2022-01-28 DIAGNOSIS — M9901 Segmental and somatic dysfunction of cervical region: Secondary | ICD-10-CM | POA: Diagnosis not present

## 2022-01-29 DIAGNOSIS — M9905 Segmental and somatic dysfunction of pelvic region: Secondary | ICD-10-CM | POA: Diagnosis not present

## 2022-01-29 DIAGNOSIS — M9902 Segmental and somatic dysfunction of thoracic region: Secondary | ICD-10-CM | POA: Diagnosis not present

## 2022-01-29 DIAGNOSIS — M9903 Segmental and somatic dysfunction of lumbar region: Secondary | ICD-10-CM | POA: Diagnosis not present

## 2022-01-29 DIAGNOSIS — M9901 Segmental and somatic dysfunction of cervical region: Secondary | ICD-10-CM | POA: Diagnosis not present

## 2022-01-31 DIAGNOSIS — M9901 Segmental and somatic dysfunction of cervical region: Secondary | ICD-10-CM | POA: Diagnosis not present

## 2022-01-31 DIAGNOSIS — M9903 Segmental and somatic dysfunction of lumbar region: Secondary | ICD-10-CM | POA: Diagnosis not present

## 2022-01-31 DIAGNOSIS — M9902 Segmental and somatic dysfunction of thoracic region: Secondary | ICD-10-CM | POA: Diagnosis not present

## 2022-01-31 DIAGNOSIS — M9905 Segmental and somatic dysfunction of pelvic region: Secondary | ICD-10-CM | POA: Diagnosis not present

## 2022-02-04 DIAGNOSIS — M9903 Segmental and somatic dysfunction of lumbar region: Secondary | ICD-10-CM | POA: Diagnosis not present

## 2022-02-04 DIAGNOSIS — M9901 Segmental and somatic dysfunction of cervical region: Secondary | ICD-10-CM | POA: Diagnosis not present

## 2022-02-04 DIAGNOSIS — M9902 Segmental and somatic dysfunction of thoracic region: Secondary | ICD-10-CM | POA: Diagnosis not present

## 2022-02-04 DIAGNOSIS — M9905 Segmental and somatic dysfunction of pelvic region: Secondary | ICD-10-CM | POA: Diagnosis not present

## 2022-02-05 NOTE — Progress Notes (Unsigned)
[facility] [address] [place], [place] [address] [phone]  Date:  02/11/2022   Name:  Christy Hill   DOB:  1956-03-16   MRN:  [id number]  PCP:  Darreld Mclean, MD    Chief Complaint: welcome to medicare (Concerns/ questions: mouth cancer/HIV screen due/Shingrix, Tdap due/Mammogram: due/Cologard: due)   History of Present Illness:  [name] is a 66 y.o. very pleasant female patient who presents with the following:  Pt seen today for a welcome to medicare physical  Last seen by myself just over a year ago  History of atrial fib, hyperthyroidism/ Graves disease s/p thyroidectomy.  No longer taking flecainide.  She monitors her heart rate with her smart watch and notes she has not seen any evidence of atrial fibrillation for over a year.  Not currently anticoagulated Her cardiologist is with [facility]-last visit 5/22: 1. PAF (paroxysmal atrial fibrillation) (*)- s/p PVI 10-2019  Paroxysmal atrial fibrillation --> strong family history AFib --> first diagnosed 2017, started flecainide Dec 2017 --> ambulatory monitor Apr 2019: confirmed paroxysmal AFib --> EKG Jun 2019: confounded a bit by IVCD and also on flecainide - on close review, probably just AFib --> rate control difficult due to baseline hypotension - digoxin didn't help --> intolerant to digoxin, metoprolol, bisoprolol, and verapamil and diltiazem --> Cryo PVI Mar 2021 --> stopped eliquis Jun 2021 Cardiac imaging --> echo Apr 2019: LVEF 55%; mild MR/TR; small pericardial effusion --> TEE Mar 2021 while AF/RVR: LVEF mildly reduced; mild LAE; mild MR/TR --> echo Jun 2021 in [facility]: LVEF 50%; mild-mod TR; trace MR  Hypothyroid --> post thyroidectomy Dec 2018 - noncancerous --> normal thyroid studies (while on armour thyroid) Oct 2019, so thyroid status is not the cause of her AFib Possible chronic Lyme disease --> not confirmed, so unclear COVID  -->  (?) possibly tested positive for the antibodies in 2020 - I'm not sure about this --> is currently (2022) being treated for "long COVID" with several meds, including maraviroc   Has declined shingrix, declines tetanus and pneumonia vaccines, declines COVID vaccination Mammogram- pt declines, I did encourage her to consider getting this done Pap- s/p total hyst for benign disease  For exercise she enjoys pickelball- she likes playing sports and generally being active She does not have any CP or SOB  She does not feel like she has trouble with depression  Family history She is seeing hand for her duputrens contracture- she is not a steroid injection candidate but they may try surgery at some point  Her father had colon cancer- she has declined colonoscopy so far but is willing to do cologuard again.  She does understand that if Cologuard is positive we will more strongly recommend colonoscopy  She sees her eye doctor; she is screened for glaucoma Never a smoker- does not need AAA screening Suggested doing a bone density and she would like to do this- ordered for her today   She is a pt at [facility] and they rx her thyroid medication  She wonders if I can prescribe this for her instead for cost savings, certainly we can do this    Patient Active Problem List   Diagnosis Date Noted   Dysmenorrhea 12/13/2014   Graves' ophthalmopathy 09/14/2013   Synovitis of finger 09/14/2013   Thyroid eye disease 04/05/2013   Vaginal spotting 12/10/2012   Hyperthyroidism 06/22/2012   History of fibromyalgia 06/21/2012  Allergic rhinitis 06/21/2012   Menopause 06/11/2012   History of migraine headaches 06/11/2012   Depression 06/11/2012   S/P cholecystectomy 06/11/2012   S/P exploratory laparotomy 06/11/2012   New onset atrial fibrillation ([facility]) 06/11/2012    Past Medical History:  Diagnosis Date   Adverse effect of general anesthetic    pt has graves disase and needs eye ointment to keeps  lubricated   Anemia    currently taking iron   Asthma    no meds, years no problems   Atrial fibrillation ([facility])    secondary to thyroid   Fibromyalgia    Hyperthyroidism    Migraines    history   PONV (postoperative nausea and vomiting)    after all surgeries including colonoscopy   Thyroid disease     Past Surgical History:  Procedure Laterality Date   APPENDECTOMY     BILATERAL SALPINGECTOMY Bilateral 12/13/2014   Procedure: BILATERAL SALPINGECTOMY;  Surgeon: Brien Few, MD;  Location: [facility];  Service: Gynecology;  Laterality: Bilateral;   BREAST ENHANCEMENT SURGERY     BREAST IMPLANT REMOVAL     BUNIONECTOMY     CESAREAN SECTION     CHOLECYSTECTOMY     COLONOSCOPY     DILATATION & CURRETTAGE/HYSTEROSCOPY WITH RESECTOCOPE N/A 03/31/2013   Procedure: DILATATION & CURETTAGE/HYSTEROSCOPY WITH RESECTOCOPE;  Surgeon: Linda Hedges, DO;  Location: [facility];  Service: Gynecology;  Laterality: N/A;   ENDOMETRIAL ABLATION     KNEE ARTHROSCOPY     ROBOTIC ASSISTED TOTAL HYSTERECTOMY N/A 12/13/2014   Procedure: ROBOTIC ASSISTED TOTAL HYSTERECTOMY;  Surgeon: Brien Few, MD;  Location: [facility];  Service: Gynecology;  Laterality: N/A;  2 1/2 hrs.    Social History   Tobacco Use   Smoking status: Never   Smokeless tobacco: Never  Substance Use Topics   Alcohol use: Yes    Alcohol/week: 1.0 - 2.0 standard drink of alcohol    Types: 1 - 2 Standard drinks or equivalent per week    Comment: HAD ETOH ON TUESDAY   Drug use: No    Family History  Problem Relation Age of Onset   Depression Mother    Dementia Mother    Cancer Father    Dementia Father    Depression Sister    Depression Brother    Pernicious anemia Paternal Aunt    Pernicious anemia Paternal Grandmother    Alcohol abuse Neg Hx    Bipolar disorder Son     Allergies  Allergen Reactions   Influenza Vaccines Other (See Comments)    AFIB   Gluten Meal    Lac Bovis Nausea Only    Cow milk only   Molds &  Smuts Other (See Comments)    Sick to stomach - headache   Pollen Extract Other (See Comments)    sneezing   Wheat Bran Other (See Comments)    Medication list has been reviewed and updated.  Current Outpatient Medications on File Prior to Visit  Medication Sig Dispense Refill   Ascorbic Acid (VITAMIN C) 1000 MG tablet Take 1,000 mg by mouth 3 (three) times daily.     Cholecalciferol (VITAMIN D3) 5000 UNITS CAPS Take 1 capsule by mouth daily.     estradiol (CLIMARA - DOSED IN MG/24 HR) 0.05 mg/24hr patch Place 0.05 mg onto the skin once a week.     Levomefolate Glucosamine (METHYL-FOLATE) 1700 MCG CAPS Take by mouth.     Multiple Vitamins-Minerals (ZINC PO) Take 1 tablet by mouth daily. With  copper     NALTREXONE HCL PO Take 4.5 mg by mouth daily. For auto immune issues     NATURE-THROID 130 MG tablet 65 mg.     Nutritional Supplements (DHEA PO) Take 100 mg by mouth daily.     Omega-3 Fatty Acids (FISH OIL) 1000 MG CAPS Take 1 capsule by mouth 3 (three) times daily.     Probiotic Product (PROBIOTIC PO) Take 1 capsule by mouth daily.     progesterone (PROMETRIUM) 100 MG capsule Take 400 mg by mouth at bedtime.     Testosterone 10 MG/ACT (2%) GEL Place 0.25-0.5 mLs onto the skin at bedtime. Apply behind the knee     UNABLE TO FIND Med Name: [place] 7 bone support     triamcinolone cream (KENALOG) 0.1 % SMARTSIG:1 Application Topical 2-3 Times Daily     No current facility-administered medications on file prior to visit.    Review of Systems:  As per HPI- otherwise negative.   Physical Examination: Vitals:   02/11/22 1313  BP: 120/70  Pulse: 73  Resp: 18  Temp: 98.1 F (36.7 C)  SpO2: 98%   Vitals:   02/11/22 1313  Weight: 136 lb 6.4 oz (61.9 kg)  Height: 5' 6.5" (1.689 m)   Body mass index is 21.69 kg/m. Ideal Body Weight: Weight in (lb) to have BMI = 25: 156.9  GEN: no acute distress.  Normal weight, looks well HEENT: Atraumatic, Normocephalic.  Ears and Nose: No  external deformity. CV: RRR, No M/G/R. No JVD. No thrill. No extra heart sounds. PULM: CTA B, no wheezes, crackles, rhonchi. No retractions. No resp. distress. No accessory muscle use. ABD: S, NT, ND, +BS. No rebound. No HSM. EXTR: No c/c/e PSYCH: Normally interactive. Conversant.  Breast exam-bilateral implants in place, otherwise exam normal.  No masses or dimpling noted  Assessment and Plan: ***  Signed Lamar Blinks, MD

## 2022-02-06 DIAGNOSIS — Z79899 Other long term (current) drug therapy: Secondary | ICD-10-CM | POA: Diagnosis not present

## 2022-02-06 DIAGNOSIS — Z129 Encounter for screening for malignant neoplasm, site unspecified: Secondary | ICD-10-CM | POA: Diagnosis not present

## 2022-02-06 DIAGNOSIS — M720 Palmar fascial fibromatosis [Dupuytren]: Secondary | ICD-10-CM | POA: Diagnosis not present

## 2022-02-06 DIAGNOSIS — R21 Rash and other nonspecific skin eruption: Secondary | ICD-10-CM | POA: Diagnosis not present

## 2022-02-06 DIAGNOSIS — L408 Other psoriasis: Secondary | ICD-10-CM | POA: Diagnosis not present

## 2022-02-06 DIAGNOSIS — M79645 Pain in left finger(s): Secondary | ICD-10-CM | POA: Diagnosis not present

## 2022-02-07 DIAGNOSIS — M9902 Segmental and somatic dysfunction of thoracic region: Secondary | ICD-10-CM | POA: Diagnosis not present

## 2022-02-07 DIAGNOSIS — M9905 Segmental and somatic dysfunction of pelvic region: Secondary | ICD-10-CM | POA: Diagnosis not present

## 2022-02-07 DIAGNOSIS — M9901 Segmental and somatic dysfunction of cervical region: Secondary | ICD-10-CM | POA: Diagnosis not present

## 2022-02-07 DIAGNOSIS — M9903 Segmental and somatic dysfunction of lumbar region: Secondary | ICD-10-CM | POA: Diagnosis not present

## 2022-02-11 ENCOUNTER — Encounter: Payer: Self-pay | Admitting: Family Medicine

## 2022-02-11 ENCOUNTER — Ambulatory Visit (INDEPENDENT_AMBULATORY_CARE_PROVIDER_SITE_OTHER): Payer: Medicare Other | Admitting: Family Medicine

## 2022-02-11 VITALS — BP 120/70 | HR 73 | Temp 98.1°F | Resp 18 | Ht 66.5 in | Wt 136.4 lb

## 2022-02-11 DIAGNOSIS — E2839 Other primary ovarian failure: Secondary | ICD-10-CM | POA: Diagnosis not present

## 2022-02-11 DIAGNOSIS — F5102 Adjustment insomnia: Secondary | ICD-10-CM

## 2022-02-11 DIAGNOSIS — Z1211 Encounter for screening for malignant neoplasm of colon: Secondary | ICD-10-CM

## 2022-02-11 DIAGNOSIS — Z13220 Encounter for screening for lipoid disorders: Secondary | ICD-10-CM | POA: Diagnosis not present

## 2022-02-11 DIAGNOSIS — N951 Menopausal and female climacteric states: Secondary | ICD-10-CM

## 2022-02-11 DIAGNOSIS — Z130 Encounter for screening for diseases of the blood and blood-forming organs and certain disorders involving the immune mechanism: Secondary | ICD-10-CM

## 2022-02-11 DIAGNOSIS — E0590 Thyrotoxicosis, unspecified without thyrotoxic crisis or storm: Secondary | ICD-10-CM

## 2022-02-11 DIAGNOSIS — Z5181 Encounter for therapeutic drug level monitoring: Secondary | ICD-10-CM | POA: Diagnosis not present

## 2022-02-11 DIAGNOSIS — Z8679 Personal history of other diseases of the circulatory system: Secondary | ICD-10-CM | POA: Diagnosis not present

## 2022-02-11 DIAGNOSIS — Z0000 Encounter for general adult medical examination without abnormal findings: Secondary | ICD-10-CM

## 2022-02-11 MED ORDER — DIAZEPAM 2 MG PO TABS: 1.0000 mg | ORAL_TABLET | Freq: Every evening | ORAL | 0 refills | Status: DC | PRN

## 2022-02-11 NOTE — Patient Instructions (Addendum)
Good to see you today- I will be in touch with your labs asap I would recommend a bone density and mammogram screening Ordered cologuard to be delivered to your home  If you have a dirty wound please be sure to get a tetanus booster

## 2022-02-12 ENCOUNTER — Encounter: Payer: Self-pay | Admitting: Family Medicine

## 2022-02-12 ENCOUNTER — Other Ambulatory Visit: Payer: Medicare Other

## 2022-02-12 ENCOUNTER — Other Ambulatory Visit: Payer: Self-pay | Admitting: *Deleted

## 2022-02-12 DIAGNOSIS — E0590 Thyrotoxicosis, unspecified without thyrotoxic crisis or storm: Secondary | ICD-10-CM

## 2022-02-12 LAB — LIPID PANEL
Cholesterol: 182 mg/dL (ref 0–200)
HDL: 68.5 mg/dL (ref >39.00)
LDL Cholesterol: 90 mg/dL (ref 0–99)
NonHDL: 113.22
Total CHOL/HDL Ratio: 3
Triglycerides: 114 mg/dL (ref 0.0–149.0)
VLDL: 22.8 mg/dL (ref 0.0–40.0)

## 2022-02-12 LAB — T3, FREE: T3, Free: 12.2 pg/mL — ABNORMAL HIGH (ref 2.3–4.2)

## 2022-02-12 LAB — COMPREHENSIVE METABOLIC PANEL
ALT: 17 U/L (ref 0–35)
AST: 21 U/L (ref 0–37)
Albumin: 4.3 g/dL (ref 3.5–5.2)
Alkaline Phosphatase: 70 U/L (ref 39–117)
BUN: 20 mg/dL (ref 6–23)
CO2: 31 mEq/L (ref 19–32)
Calcium: 9.4 mg/dL (ref 8.4–10.5)
Chloride: 102 mEq/L (ref 96–112)
Creatinine, Ser: 0.84 mg/dL (ref 0.40–1.20)
GFR: 73.15 mL/min (ref >60.00)
Glucose, Bld: 73 mg/dL (ref 70–99)
Potassium: 4.2 mEq/L (ref 3.5–5.1)
Sodium: 140 mEq/L (ref 135–145)
Total Bilirubin: 0.5 mg/dL (ref 0.2–1.2)
Total Protein: 6.3 g/dL (ref 6.0–8.3)

## 2022-02-12 LAB — CBC
HCT: 43.3 % (ref 36.0–46.0)
Hemoglobin: 14.2 g/dL (ref 12.0–15.0)
MCHC: 32.7 g/dL (ref 30.0–36.0)
MCV: 94.4 fl (ref 78.0–100.0)
Platelets: 263 10*3/uL (ref 150.0–400.0)
RBC: 4.59 Mil/uL (ref 3.87–5.11)
RDW: 13.7 % (ref 11.5–15.5)
WBC: 8.8 10*3/uL (ref 4.0–10.5)

## 2022-02-12 LAB — TSH: TSH: <0.01 u[IU]/mL — ABNORMAL LOW (ref 0.35–5.50)

## 2022-02-12 LAB — T4, FREE: Free T4: 2.4 ng/dL — ABNORMAL HIGH (ref 0.60–1.60)

## 2022-02-12 MED ORDER — ESTRADIOL 0.075 MG/24HR TD PTWK: 0.0750 mg | MEDICATED_PATCH | TRANSDERMAL | 3 refills | Status: DC

## 2022-02-12 MED ORDER — PROGESTERONE MICRONIZED 100 MG PO CAPS: 200.0000 mg | ORAL_CAPSULE | Freq: Every day | ORAL | 3 refills | Status: DC

## 2022-02-12 MED ORDER — THYROID 120 MG PO TABS: 120.0000 mg | ORAL_TABLET | Freq: Every day | ORAL | 3 refills | Status: DC

## 2022-02-13 ENCOUNTER — Other Ambulatory Visit: Payer: Self-pay | Admitting: Family Medicine

## 2022-02-13 DIAGNOSIS — E0590 Thyrotoxicosis, unspecified without thyrotoxic crisis or storm: Secondary | ICD-10-CM

## 2022-02-13 MED ORDER — THYROID 60 MG PO TABS: 60.0000 mg | ORAL_TABLET | Freq: Every day | ORAL | 3 refills | Status: DC

## 2022-02-14 DIAGNOSIS — M9905 Segmental and somatic dysfunction of pelvic region: Secondary | ICD-10-CM | POA: Diagnosis not present

## 2022-02-14 DIAGNOSIS — M9902 Segmental and somatic dysfunction of thoracic region: Secondary | ICD-10-CM | POA: Diagnosis not present

## 2022-02-14 DIAGNOSIS — M9901 Segmental and somatic dysfunction of cervical region: Secondary | ICD-10-CM | POA: Diagnosis not present

## 2022-02-14 DIAGNOSIS — M9903 Segmental and somatic dysfunction of lumbar region: Secondary | ICD-10-CM | POA: Diagnosis not present

## 2022-02-15 ENCOUNTER — Encounter: Payer: Self-pay | Admitting: Family Medicine

## 2022-02-15 LAB — T4: T4, Total: 5.8 ug/dL (ref 5.1–11.9)

## 2022-02-15 LAB — T3, REVERSE: T3, Reverse: 11 ng/dL (ref 8–25)

## 2022-02-15 LAB — T3: T3, Total: 174 ng/dL (ref 76–181)

## 2022-02-18 ENCOUNTER — Encounter: Payer: Self-pay | Admitting: Family Medicine

## 2022-02-19 ENCOUNTER — Encounter: Payer: Self-pay | Admitting: Family Medicine

## 2022-02-19 ENCOUNTER — Telehealth: Payer: Self-pay | Admitting: Family Medicine

## 2022-02-19 DIAGNOSIS — M9902 Segmental and somatic dysfunction of thoracic region: Secondary | ICD-10-CM | POA: Diagnosis not present

## 2022-02-19 DIAGNOSIS — Z1211 Encounter for screening for malignant neoplasm of colon: Secondary | ICD-10-CM | POA: Diagnosis not present

## 2022-02-19 DIAGNOSIS — M9903 Segmental and somatic dysfunction of lumbar region: Secondary | ICD-10-CM | POA: Diagnosis not present

## 2022-02-19 DIAGNOSIS — M9901 Segmental and somatic dysfunction of cervical region: Secondary | ICD-10-CM | POA: Diagnosis not present

## 2022-02-19 DIAGNOSIS — M9905 Segmental and somatic dysfunction of pelvic region: Secondary | ICD-10-CM | POA: Diagnosis not present

## 2022-02-19 NOTE — Telephone Encounter (Signed)
Rhea from [employer] calling to advise that Armour Thyroid is excluded from Part D coverage and please advise.    Callback # [phone]

## 2022-02-19 NOTE — Telephone Encounter (Signed)
Please advise 

## 2022-03-01 ENCOUNTER — Encounter: Payer: Self-pay | Admitting: Family Medicine

## 2022-03-01 DIAGNOSIS — R195 Other fecal abnormalities: Secondary | ICD-10-CM

## 2022-03-01 LAB — COLOGUARD: COLOGUARD: POSITIVE — AB

## 2022-03-04 ENCOUNTER — Other Ambulatory Visit (INDEPENDENT_AMBULATORY_CARE_PROVIDER_SITE_OTHER): Payer: Medicare Other

## 2022-03-04 ENCOUNTER — Telehealth: Payer: Self-pay | Admitting: Family Medicine

## 2022-03-04 ENCOUNTER — Encounter: Payer: Self-pay | Admitting: Family Medicine

## 2022-03-04 DIAGNOSIS — E0590 Thyrotoxicosis, unspecified without thyrotoxic crisis or storm: Secondary | ICD-10-CM

## 2022-03-04 LAB — TSH: TSH: 0.04 u[IU]/mL — ABNORMAL LOW (ref 0.35–5.50)

## 2022-03-04 LAB — T3, FREE: T3, Free: 9.5 pg/mL — ABNORMAL HIGH (ref 2.3–4.2)

## 2022-03-04 NOTE — Telephone Encounter (Signed)
[facility] stated Dr.Mann is not able to see pt.

## 2022-03-04 NOTE — Telephone Encounter (Signed)
FYI

## 2022-03-04 NOTE — Addendum Note (Signed)
Addended by: Lamar Blinks C on: 03/04/2022 08:25 AM   Modules accepted: Orders

## 2022-03-05 DIAGNOSIS — M9901 Segmental and somatic dysfunction of cervical region: Secondary | ICD-10-CM | POA: Diagnosis not present

## 2022-03-05 DIAGNOSIS — H04123 Dry eye syndrome of bilateral lacrimal glands: Secondary | ICD-10-CM | POA: Diagnosis not present

## 2022-03-05 DIAGNOSIS — H2513 Age-related nuclear cataract, bilateral: Secondary | ICD-10-CM | POA: Diagnosis not present

## 2022-03-05 DIAGNOSIS — M9902 Segmental and somatic dysfunction of thoracic region: Secondary | ICD-10-CM | POA: Diagnosis not present

## 2022-03-05 DIAGNOSIS — M9903 Segmental and somatic dysfunction of lumbar region: Secondary | ICD-10-CM | POA: Diagnosis not present

## 2022-03-05 DIAGNOSIS — D3132 Benign neoplasm of left choroid: Secondary | ICD-10-CM | POA: Diagnosis not present

## 2022-03-05 DIAGNOSIS — M9905 Segmental and somatic dysfunction of pelvic region: Secondary | ICD-10-CM | POA: Diagnosis not present

## 2022-03-07 DIAGNOSIS — H25013 Cortical age-related cataract, bilateral: Secondary | ICD-10-CM | POA: Diagnosis not present

## 2022-03-07 DIAGNOSIS — M9905 Segmental and somatic dysfunction of pelvic region: Secondary | ICD-10-CM | POA: Diagnosis not present

## 2022-03-07 DIAGNOSIS — H18413 Arcus senilis, bilateral: Secondary | ICD-10-CM | POA: Diagnosis not present

## 2022-03-07 DIAGNOSIS — M9903 Segmental and somatic dysfunction of lumbar region: Secondary | ICD-10-CM | POA: Diagnosis not present

## 2022-03-07 DIAGNOSIS — M9901 Segmental and somatic dysfunction of cervical region: Secondary | ICD-10-CM | POA: Diagnosis not present

## 2022-03-07 DIAGNOSIS — H2513 Age-related nuclear cataract, bilateral: Secondary | ICD-10-CM | POA: Diagnosis not present

## 2022-03-07 DIAGNOSIS — M9902 Segmental and somatic dysfunction of thoracic region: Secondary | ICD-10-CM | POA: Diagnosis not present

## 2022-03-07 DIAGNOSIS — H35342 Macular cyst, hole, or pseudohole, left eye: Secondary | ICD-10-CM | POA: Diagnosis not present

## 2022-03-11 DIAGNOSIS — R079 Chest pain, unspecified: Secondary | ICD-10-CM | POA: Diagnosis not present

## 2022-03-11 DIAGNOSIS — R195 Other fecal abnormalities: Secondary | ICD-10-CM | POA: Diagnosis not present

## 2022-03-11 DIAGNOSIS — F4310 Post-traumatic stress disorder, unspecified: Secondary | ICD-10-CM | POA: Diagnosis not present

## 2022-03-11 DIAGNOSIS — I480 Paroxysmal atrial fibrillation: Secondary | ICD-10-CM | POA: Diagnosis not present

## 2022-03-11 DIAGNOSIS — E0501 Thyrotoxicosis with diffuse goiter with thyrotoxic crisis or storm: Secondary | ICD-10-CM | POA: Diagnosis not present

## 2022-03-12 DIAGNOSIS — M9901 Segmental and somatic dysfunction of cervical region: Secondary | ICD-10-CM | POA: Diagnosis not present

## 2022-03-12 DIAGNOSIS — M9903 Segmental and somatic dysfunction of lumbar region: Secondary | ICD-10-CM | POA: Diagnosis not present

## 2022-03-12 DIAGNOSIS — M9902 Segmental and somatic dysfunction of thoracic region: Secondary | ICD-10-CM | POA: Diagnosis not present

## 2022-03-12 DIAGNOSIS — M9905 Segmental and somatic dysfunction of pelvic region: Secondary | ICD-10-CM | POA: Diagnosis not present

## 2022-03-14 ENCOUNTER — Encounter: Payer: Self-pay | Admitting: Family Medicine

## 2022-03-14 ENCOUNTER — Ambulatory Visit (HOSPITAL_BASED_OUTPATIENT_CLINIC_OR_DEPARTMENT_OTHER)
Admission: RE | Admit: 2022-03-14 | Discharge: 2022-03-14 | Disposition: A | Discharge status: Home / Self Care | Payer: Medicare Other | Source: Ambulatory Visit | Attending: Family Medicine | Admitting: Family Medicine

## 2022-03-14 DIAGNOSIS — Z780 Asymptomatic menopausal state: Secondary | ICD-10-CM | POA: Diagnosis not present

## 2022-03-14 DIAGNOSIS — E2839 Other primary ovarian failure: Secondary | ICD-10-CM | POA: Diagnosis not present

## 2022-03-14 DIAGNOSIS — M8580 Other specified disorders of bone density and structure, unspecified site: Secondary | ICD-10-CM | POA: Insufficient documentation

## 2022-03-14 DIAGNOSIS — M85832 Other specified disorders of bone density and structure, left forearm: Secondary | ICD-10-CM | POA: Diagnosis not present

## 2022-03-14 NOTE — Addendum Note (Signed)
Addended by: Kem Boroughs D on: 03/14/2022 01:36 PM   Modules accepted: Orders

## 2022-03-15 ENCOUNTER — Other Ambulatory Visit (INDEPENDENT_AMBULATORY_CARE_PROVIDER_SITE_OTHER): Payer: Medicare Other

## 2022-03-15 DIAGNOSIS — E0590 Thyrotoxicosis, unspecified without thyrotoxic crisis or storm: Secondary | ICD-10-CM

## 2022-03-16 ENCOUNTER — Encounter: Payer: Self-pay | Admitting: Family Medicine

## 2022-03-16 LAB — TSH: TSH: 0.21 mIU/L — ABNORMAL LOW (ref 0.40–4.50)

## 2022-03-16 LAB — T3, FREE: T3, Free: 3.3 pg/mL (ref 2.3–4.2)

## 2022-03-18 DIAGNOSIS — M9901 Segmental and somatic dysfunction of cervical region: Secondary | ICD-10-CM | POA: Diagnosis not present

## 2022-03-18 DIAGNOSIS — M9905 Segmental and somatic dysfunction of pelvic region: Secondary | ICD-10-CM | POA: Diagnosis not present

## 2022-03-18 DIAGNOSIS — M9902 Segmental and somatic dysfunction of thoracic region: Secondary | ICD-10-CM | POA: Diagnosis not present

## 2022-03-18 DIAGNOSIS — M9903 Segmental and somatic dysfunction of lumbar region: Secondary | ICD-10-CM | POA: Diagnosis not present

## 2022-03-20 ENCOUNTER — Encounter: Payer: Self-pay | Admitting: Family Medicine

## 2022-03-20 LAB — THYROID STIMULATING IMMUNOGLOBULIN: TSI: <89 % baseline (ref <140)

## 2022-03-21 DIAGNOSIS — M9905 Segmental and somatic dysfunction of pelvic region: Secondary | ICD-10-CM | POA: Diagnosis not present

## 2022-03-21 DIAGNOSIS — M9901 Segmental and somatic dysfunction of cervical region: Secondary | ICD-10-CM | POA: Diagnosis not present

## 2022-03-21 DIAGNOSIS — M9902 Segmental and somatic dysfunction of thoracic region: Secondary | ICD-10-CM | POA: Diagnosis not present

## 2022-03-21 DIAGNOSIS — M9903 Segmental and somatic dysfunction of lumbar region: Secondary | ICD-10-CM | POA: Diagnosis not present

## 2022-03-25 ENCOUNTER — Encounter: Payer: Self-pay | Admitting: Family Medicine

## 2022-03-25 DIAGNOSIS — N951 Menopausal and female climacteric states: Secondary | ICD-10-CM

## 2022-03-25 MED ORDER — ESTRADIOL 0.075 MG/24HR TD PTWK: 0.0750 mg | MEDICATED_PATCH | TRANSDERMAL | 1 refills | Status: DC

## 2022-03-26 DIAGNOSIS — M9903 Segmental and somatic dysfunction of lumbar region: Secondary | ICD-10-CM | POA: Diagnosis not present

## 2022-03-26 DIAGNOSIS — M9901 Segmental and somatic dysfunction of cervical region: Secondary | ICD-10-CM | POA: Diagnosis not present

## 2022-03-26 DIAGNOSIS — M9905 Segmental and somatic dysfunction of pelvic region: Secondary | ICD-10-CM | POA: Diagnosis not present

## 2022-03-26 DIAGNOSIS — M9902 Segmental and somatic dysfunction of thoracic region: Secondary | ICD-10-CM | POA: Diagnosis not present

## 2022-03-28 DIAGNOSIS — M9905 Segmental and somatic dysfunction of pelvic region: Secondary | ICD-10-CM | POA: Diagnosis not present

## 2022-03-28 DIAGNOSIS — M9901 Segmental and somatic dysfunction of cervical region: Secondary | ICD-10-CM | POA: Diagnosis not present

## 2022-03-28 DIAGNOSIS — M9903 Segmental and somatic dysfunction of lumbar region: Secondary | ICD-10-CM | POA: Diagnosis not present

## 2022-03-28 DIAGNOSIS — M9902 Segmental and somatic dysfunction of thoracic region: Secondary | ICD-10-CM | POA: Diagnosis not present

## 2022-04-02 DIAGNOSIS — M9902 Segmental and somatic dysfunction of thoracic region: Secondary | ICD-10-CM | POA: Diagnosis not present

## 2022-04-02 DIAGNOSIS — M9901 Segmental and somatic dysfunction of cervical region: Secondary | ICD-10-CM | POA: Diagnosis not present

## 2022-04-02 DIAGNOSIS — M9903 Segmental and somatic dysfunction of lumbar region: Secondary | ICD-10-CM | POA: Diagnosis not present

## 2022-04-02 DIAGNOSIS — M9905 Segmental and somatic dysfunction of pelvic region: Secondary | ICD-10-CM | POA: Diagnosis not present

## 2022-04-03 DIAGNOSIS — H43813 Vitreous degeneration, bilateral: Secondary | ICD-10-CM | POA: Diagnosis not present

## 2022-04-03 DIAGNOSIS — H43393 Other vitreous opacities, bilateral: Secondary | ICD-10-CM | POA: Diagnosis not present

## 2022-04-03 DIAGNOSIS — H35372 Puckering of macula, left eye: Secondary | ICD-10-CM | POA: Diagnosis not present

## 2022-04-03 DIAGNOSIS — H33321 Round hole, right eye: Secondary | ICD-10-CM | POA: Diagnosis not present

## 2022-04-10 DIAGNOSIS — R079 Chest pain, unspecified: Secondary | ICD-10-CM | POA: Diagnosis not present

## 2022-04-11 DIAGNOSIS — M9903 Segmental and somatic dysfunction of lumbar region: Secondary | ICD-10-CM | POA: Diagnosis not present

## 2022-04-11 DIAGNOSIS — M9901 Segmental and somatic dysfunction of cervical region: Secondary | ICD-10-CM | POA: Diagnosis not present

## 2022-04-11 DIAGNOSIS — M9905 Segmental and somatic dysfunction of pelvic region: Secondary | ICD-10-CM | POA: Diagnosis not present

## 2022-04-11 DIAGNOSIS — M9902 Segmental and somatic dysfunction of thoracic region: Secondary | ICD-10-CM | POA: Diagnosis not present

## 2022-04-15 ENCOUNTER — Encounter: Payer: Self-pay | Admitting: Family Medicine

## 2022-04-15 DIAGNOSIS — J9811 Atelectasis: Secondary | ICD-10-CM

## 2022-04-15 DIAGNOSIS — M25561 Pain in right knee: Secondary | ICD-10-CM | POA: Diagnosis not present

## 2022-04-16 DIAGNOSIS — M9905 Segmental and somatic dysfunction of pelvic region: Secondary | ICD-10-CM | POA: Diagnosis not present

## 2022-04-16 DIAGNOSIS — M9903 Segmental and somatic dysfunction of lumbar region: Secondary | ICD-10-CM | POA: Diagnosis not present

## 2022-04-16 DIAGNOSIS — M9901 Segmental and somatic dysfunction of cervical region: Secondary | ICD-10-CM | POA: Diagnosis not present

## 2022-04-16 DIAGNOSIS — M9902 Segmental and somatic dysfunction of thoracic region: Secondary | ICD-10-CM | POA: Diagnosis not present

## 2022-04-17 NOTE — Addendum Note (Signed)
Addended by: Lamar Blinks C on: 04/17/2022 02:25 PM   Modules accepted: Orders

## 2022-04-18 DIAGNOSIS — Z800 Family history of malignant neoplasm of digestive organs: Secondary | ICD-10-CM | POA: Diagnosis not present

## 2022-04-18 DIAGNOSIS — D122 Benign neoplasm of ascending colon: Secondary | ICD-10-CM | POA: Diagnosis not present

## 2022-04-18 DIAGNOSIS — K635 Polyp of colon: Secondary | ICD-10-CM | POA: Diagnosis not present

## 2022-04-18 DIAGNOSIS — R195 Other fecal abnormalities: Secondary | ICD-10-CM | POA: Diagnosis not present

## 2022-04-18 DIAGNOSIS — Z1211 Encounter for screening for malignant neoplasm of colon: Secondary | ICD-10-CM | POA: Diagnosis not present

## 2022-04-18 LAB — HM COLONOSCOPY

## 2022-04-22 ENCOUNTER — Encounter: Payer: Self-pay | Admitting: Family Medicine

## 2022-04-22 DIAGNOSIS — E0590 Thyrotoxicosis, unspecified without thyrotoxic crisis or storm: Secondary | ICD-10-CM

## 2022-04-22 MED ORDER — LIOTHYRONINE SODIUM 5 MCG PO TABS: 5.0000 ug | ORAL_TABLET | Freq: Every day | ORAL | 3 refills | Status: DC

## 2022-04-23 ENCOUNTER — Encounter: Payer: Self-pay | Admitting: Family Medicine

## 2022-04-23 DIAGNOSIS — M9903 Segmental and somatic dysfunction of lumbar region: Secondary | ICD-10-CM | POA: Diagnosis not present

## 2022-04-23 DIAGNOSIS — M9901 Segmental and somatic dysfunction of cervical region: Secondary | ICD-10-CM | POA: Diagnosis not present

## 2022-04-23 DIAGNOSIS — M9902 Segmental and somatic dysfunction of thoracic region: Secondary | ICD-10-CM | POA: Diagnosis not present

## 2022-04-23 DIAGNOSIS — M9905 Segmental and somatic dysfunction of pelvic region: Secondary | ICD-10-CM | POA: Diagnosis not present

## 2022-04-25 NOTE — Telephone Encounter (Signed)
Rec release sent.

## 2022-04-29 ENCOUNTER — Telehealth: Payer: Self-pay | Admitting: Family Medicine

## 2022-04-29 NOTE — Telephone Encounter (Signed)
[facility] called to advise that a request for medical records was faxed to them for this patient but it should have been sent to [place]. Please fax again to correct office.

## 2022-04-30 DIAGNOSIS — M9902 Segmental and somatic dysfunction of thoracic region: Secondary | ICD-10-CM | POA: Diagnosis not present

## 2022-04-30 DIAGNOSIS — M9901 Segmental and somatic dysfunction of cervical region: Secondary | ICD-10-CM | POA: Diagnosis not present

## 2022-04-30 DIAGNOSIS — M9903 Segmental and somatic dysfunction of lumbar region: Secondary | ICD-10-CM | POA: Diagnosis not present

## 2022-04-30 DIAGNOSIS — M9905 Segmental and somatic dysfunction of pelvic region: Secondary | ICD-10-CM | POA: Diagnosis not present

## 2022-05-13 ENCOUNTER — Encounter: Payer: Self-pay | Admitting: Family Medicine

## 2022-05-13 DIAGNOSIS — E0590 Thyrotoxicosis, unspecified without thyrotoxic crisis or storm: Secondary | ICD-10-CM

## 2022-05-15 DIAGNOSIS — H2512 Age-related nuclear cataract, left eye: Secondary | ICD-10-CM | POA: Diagnosis not present

## 2022-05-17 ENCOUNTER — Other Ambulatory Visit (INDEPENDENT_AMBULATORY_CARE_PROVIDER_SITE_OTHER): Payer: Medicare Other

## 2022-05-17 ENCOUNTER — Encounter: Payer: Self-pay | Admitting: Family Medicine

## 2022-05-17 DIAGNOSIS — N951 Menopausal and female climacteric states: Secondary | ICD-10-CM

## 2022-05-17 DIAGNOSIS — E0590 Thyrotoxicosis, unspecified without thyrotoxic crisis or storm: Secondary | ICD-10-CM | POA: Diagnosis not present

## 2022-05-17 LAB — TSH: TSH: 7.91 u[IU]/mL — ABNORMAL HIGH (ref 0.35–5.50)

## 2022-05-17 LAB — T4, FREE: Free T4: 0.66 ng/dL (ref 0.60–1.60)

## 2022-05-17 LAB — T3, FREE: T3, Free: 4.1 pg/mL (ref 2.3–4.2)

## 2022-05-20 DIAGNOSIS — M720 Palmar fascial fibromatosis [Dupuytren]: Secondary | ICD-10-CM | POA: Diagnosis not present

## 2022-05-20 MED ORDER — PROGESTERONE MICRONIZED 100 MG PO CAPS: 200.0000 mg | ORAL_CAPSULE | Freq: Every day | ORAL | 3 refills | Status: DC

## 2022-06-13 DIAGNOSIS — H35372 Puckering of macula, left eye: Secondary | ICD-10-CM | POA: Diagnosis not present

## 2022-06-19 DIAGNOSIS — I781 Nevus, non-neoplastic: Secondary | ICD-10-CM | POA: Diagnosis not present

## 2022-06-19 DIAGNOSIS — D485 Neoplasm of uncertain behavior of skin: Secondary | ICD-10-CM | POA: Diagnosis not present

## 2022-08-14 DIAGNOSIS — K080 Exfoliation of teeth due to systemic causes: Secondary | ICD-10-CM | POA: Diagnosis not present

## 2022-08-19 ENCOUNTER — Encounter: Payer: Self-pay | Admitting: Family Medicine

## 2022-08-19 DIAGNOSIS — R413 Other amnesia: Secondary | ICD-10-CM

## 2022-08-19 NOTE — Addendum Note (Signed)
Addended by: Lamar Blinks C on: 08/19/2022 04:47 PM   Modules accepted: Orders

## 2022-08-21 ENCOUNTER — Encounter: Payer: Self-pay | Admitting: Family Medicine

## 2022-08-21 DIAGNOSIS — M720 Palmar fascial fibromatosis [Dupuytren]: Secondary | ICD-10-CM | POA: Diagnosis not present

## 2022-08-21 DIAGNOSIS — R413 Other amnesia: Secondary | ICD-10-CM

## 2022-08-30 DIAGNOSIS — H35342 Macular cyst, hole, or pseudohole, left eye: Secondary | ICD-10-CM | POA: Diagnosis not present

## 2022-09-04 DIAGNOSIS — H43813 Vitreous degeneration, bilateral: Secondary | ICD-10-CM | POA: Diagnosis not present

## 2022-09-04 DIAGNOSIS — H35342 Macular cyst, hole, or pseudohole, left eye: Secondary | ICD-10-CM | POA: Diagnosis not present

## 2022-09-04 DIAGNOSIS — H43393 Other vitreous opacities, bilateral: Secondary | ICD-10-CM | POA: Diagnosis not present

## 2022-09-04 DIAGNOSIS — H35372 Puckering of macula, left eye: Secondary | ICD-10-CM | POA: Diagnosis not present

## 2022-09-23 DIAGNOSIS — K080 Exfoliation of teeth due to systemic causes: Secondary | ICD-10-CM | POA: Diagnosis not present

## 2022-09-30 DIAGNOSIS — H35342 Macular cyst, hole, or pseudohole, left eye: Secondary | ICD-10-CM | POA: Diagnosis not present

## 2022-10-01 DIAGNOSIS — H35342 Macular cyst, hole, or pseudohole, left eye: Secondary | ICD-10-CM | POA: Diagnosis not present

## 2022-10-01 DIAGNOSIS — Z9889 Other specified postprocedural states: Secondary | ICD-10-CM | POA: Diagnosis not present

## 2022-10-08 DIAGNOSIS — H35342 Macular cyst, hole, or pseudohole, left eye: Secondary | ICD-10-CM | POA: Diagnosis not present

## 2022-10-08 DIAGNOSIS — Z9889 Other specified postprocedural states: Secondary | ICD-10-CM | POA: Diagnosis not present

## 2022-10-09 DIAGNOSIS — H43392 Other vitreous opacities, left eye: Secondary | ICD-10-CM | POA: Diagnosis not present

## 2022-10-09 DIAGNOSIS — H35342 Macular cyst, hole, or pseudohole, left eye: Secondary | ICD-10-CM | POA: Diagnosis not present

## 2022-10-09 DIAGNOSIS — Z9889 Other specified postprocedural states: Secondary | ICD-10-CM | POA: Diagnosis not present

## 2022-10-10 ENCOUNTER — Encounter: Payer: Self-pay | Admitting: Family Medicine

## 2022-10-10 MED ORDER — VIVELLE-DOT 0.075 MG/24HR TD PTTW: 1.0000 | MEDICATED_PATCH | TRANSDERMAL | 3 refills | Status: DC

## 2022-10-11 ENCOUNTER — Other Ambulatory Visit: Payer: Self-pay | Admitting: Family Medicine

## 2022-10-11 DIAGNOSIS — F5102 Adjustment insomnia: Secondary | ICD-10-CM

## 2022-10-14 ENCOUNTER — Other Ambulatory Visit: Payer: Self-pay | Admitting: Family Medicine

## 2022-10-14 DIAGNOSIS — F5102 Adjustment insomnia: Secondary | ICD-10-CM

## 2022-10-14 DIAGNOSIS — K080 Exfoliation of teeth due to systemic causes: Secondary | ICD-10-CM | POA: Diagnosis not present

## 2022-10-15 ENCOUNTER — Encounter: Payer: Self-pay | Admitting: Family Medicine

## 2022-10-17 DIAGNOSIS — M25561 Pain in right knee: Secondary | ICD-10-CM | POA: Diagnosis not present

## 2022-10-22 MED ORDER — ESTRADIOL 0.075 MG/24HR TD PTTW: 1.0000 | MEDICATED_PATCH | TRANSDERMAL | 3 refills | Status: DC

## 2022-10-22 NOTE — Addendum Note (Signed)
Addended by: Lamar Blinks C on: 10/22/2022 08:41 PM   Modules accepted: Orders

## 2022-10-29 DIAGNOSIS — Z9889 Other specified postprocedural states: Secondary | ICD-10-CM | POA: Diagnosis not present

## 2022-10-29 DIAGNOSIS — H35342 Macular cyst, hole, or pseudohole, left eye: Secondary | ICD-10-CM | POA: Diagnosis not present

## 2022-11-01 DIAGNOSIS — M25561 Pain in right knee: Secondary | ICD-10-CM | POA: Diagnosis not present

## 2022-11-04 ENCOUNTER — Encounter: Payer: Self-pay | Admitting: Family Medicine

## 2022-11-04 DIAGNOSIS — K080 Exfoliation of teeth due to systemic causes: Secondary | ICD-10-CM | POA: Diagnosis not present

## 2022-11-07 DIAGNOSIS — H524 Presbyopia: Secondary | ICD-10-CM | POA: Diagnosis not present

## 2022-11-08 ENCOUNTER — Encounter: Payer: Self-pay | Admitting: Family Medicine

## 2022-11-08 DIAGNOSIS — N6320 Unspecified lump in the left breast, unspecified quadrant: Secondary | ICD-10-CM

## 2022-11-08 DIAGNOSIS — Z1231 Encounter for screening mammogram for malignant neoplasm of breast: Secondary | ICD-10-CM

## 2022-11-29 NOTE — Addendum Note (Signed)
Addended by: Abbe Amsterdam C on: 11/29/2022 12:26 PM   Modules accepted: Orders

## 2022-12-02 DIAGNOSIS — D485 Neoplasm of uncertain behavior of skin: Secondary | ICD-10-CM | POA: Diagnosis not present

## 2022-12-02 DIAGNOSIS — L570 Actinic keratosis: Secondary | ICD-10-CM | POA: Diagnosis not present

## 2022-12-02 DIAGNOSIS — D692 Other nonthrombocytopenic purpura: Secondary | ICD-10-CM | POA: Diagnosis not present

## 2022-12-02 DIAGNOSIS — D3610 Benign neoplasm of peripheral nerves and autonomic nervous system, unspecified: Secondary | ICD-10-CM | POA: Diagnosis not present

## 2022-12-02 DIAGNOSIS — I781 Nevus, non-neoplastic: Secondary | ICD-10-CM | POA: Diagnosis not present

## 2022-12-05 ENCOUNTER — Other Ambulatory Visit: Payer: Self-pay | Admitting: Family Medicine

## 2022-12-05 DIAGNOSIS — N6320 Unspecified lump in the left breast, unspecified quadrant: Secondary | ICD-10-CM

## 2022-12-06 DIAGNOSIS — K080 Exfoliation of teeth due to systemic causes: Secondary | ICD-10-CM | POA: Diagnosis not present

## 2022-12-12 ENCOUNTER — Encounter: Payer: Self-pay | Admitting: Family Medicine

## 2022-12-19 ENCOUNTER — Ambulatory Visit
Admission: RE | Admit: 2022-12-19 | Discharge: 2022-12-19 | Disposition: A | Discharge status: Home / Self Care | Payer: Medicare Other | Source: Ambulatory Visit | Attending: Family Medicine | Admitting: Family Medicine

## 2022-12-19 ENCOUNTER — Other Ambulatory Visit: Payer: Medicare Other

## 2022-12-19 DIAGNOSIS — R92333 Mammographic heterogeneous density, bilateral breasts: Secondary | ICD-10-CM | POA: Diagnosis not present

## 2022-12-19 DIAGNOSIS — N6002 Solitary cyst of left breast: Secondary | ICD-10-CM | POA: Diagnosis not present

## 2022-12-19 DIAGNOSIS — N6320 Unspecified lump in the left breast, unspecified quadrant: Secondary | ICD-10-CM

## 2022-12-19 DIAGNOSIS — N6001 Solitary cyst of right breast: Secondary | ICD-10-CM | POA: Diagnosis not present

## 2022-12-24 ENCOUNTER — Encounter (INDEPENDENT_AMBULATORY_CARE_PROVIDER_SITE_OTHER): Payer: Medicare Other | Admitting: Family Medicine

## 2022-12-24 DIAGNOSIS — E039 Hypothyroidism, unspecified: Secondary | ICD-10-CM | POA: Diagnosis not present

## 2022-12-24 NOTE — Telephone Encounter (Signed)
Please see the MyChart message reply(ies) for my assessment and plan.  The patient gave consent for this Medical Advice Message and is aware that it may result in a bill to their insurance company as well as the possibility that this may result in a co-payment or deductible. They are an established patient, but are not seeking medical advice exclusively about a problem treated during an in person or video visit in the last 7 days. I did not recommend an in person or video visit within 7 days of my reply.  I spent a total of 10 minutes cumulative time within 7 days through MyChart messaging Maclin Guerrette, MD  

## 2022-12-26 ENCOUNTER — Other Ambulatory Visit (INDEPENDENT_AMBULATORY_CARE_PROVIDER_SITE_OTHER): Payer: Medicare Other

## 2022-12-26 ENCOUNTER — Encounter: Payer: Self-pay | Admitting: Family Medicine

## 2022-12-26 DIAGNOSIS — E039 Hypothyroidism, unspecified: Secondary | ICD-10-CM

## 2022-12-26 LAB — T3, FREE: T3, Free: 3.8 pg/mL (ref 2.3–4.2)

## 2022-12-26 LAB — T4, FREE: Free T4: 0.74 ng/dL (ref 0.60–1.60)

## 2022-12-26 LAB — TSH: TSH: 10.61 u[IU]/mL — ABNORMAL HIGH (ref 0.35–5.50)

## 2022-12-26 NOTE — Telephone Encounter (Signed)
Please see the MyChart message reply(ies) for my assessment and plan.  The patient gave consent for this Medical Advice Message and is aware that it may result in a bill to their insurance company as well as the possibility that this may result in a co-payment or deductible. They are an established patient, but are not seeking medical advice exclusively about a problem treated during an in person or video visit in the last 7 days. I did not recommend an in person or video visit within 7 days of my reply.  I spent a total of 15 minutes cumulative time within 7 days through MyChart messaging Brycen Bean, MD  

## 2022-12-31 ENCOUNTER — Other Ambulatory Visit: Payer: Self-pay | Admitting: Family Medicine

## 2022-12-31 DIAGNOSIS — N6310 Unspecified lump in the right breast, unspecified quadrant: Secondary | ICD-10-CM

## 2022-12-31 DIAGNOSIS — N6320 Unspecified lump in the left breast, unspecified quadrant: Secondary | ICD-10-CM

## 2023-01-10 ENCOUNTER — Encounter: Payer: Self-pay | Admitting: Family Medicine

## 2023-01-10 MED ORDER — SCOPOLAMINE 1 MG/3DAYS TD PT72: 1.0000 | MEDICATED_PATCH | TRANSDERMAL | 0 refills | Status: DC

## 2023-01-13 ENCOUNTER — Other Ambulatory Visit: Payer: Self-pay | Admitting: Family Medicine

## 2023-01-13 DIAGNOSIS — E0590 Thyrotoxicosis, unspecified without thyrotoxic crisis or storm: Secondary | ICD-10-CM

## 2023-01-31 ENCOUNTER — Encounter: Payer: Self-pay | Admitting: Family Medicine

## 2023-01-31 DIAGNOSIS — R197 Diarrhea, unspecified: Secondary | ICD-10-CM

## 2023-01-31 NOTE — Telephone Encounter (Signed)
Please see the MyChart message reply(ies) for my assessment and plan.  The patient gave consent for this Medical Advice Message and is aware that it may result in a bill to their insurance company as well as the possibility that this may result in a co-payment or deductible. They are an established patient, but are not seeking medical advice exclusively about a problem treated during an in person or video visit in the last 7 days. I did not recommend an in person or video visit within 7 days of my reply.  I spent a total of 10 minutes cumulative time within 7 days through MyChart messaging Sania Noy, MD  

## 2023-02-03 DIAGNOSIS — M25561 Pain in right knee: Secondary | ICD-10-CM | POA: Diagnosis not present

## 2023-02-03 DIAGNOSIS — K080 Exfoliation of teeth due to systemic causes: Secondary | ICD-10-CM | POA: Diagnosis not present

## 2023-02-12 DIAGNOSIS — L408 Other psoriasis: Secondary | ICD-10-CM | POA: Diagnosis not present

## 2023-02-12 DIAGNOSIS — L738 Other specified follicular disorders: Secondary | ICD-10-CM | POA: Diagnosis not present

## 2023-02-12 DIAGNOSIS — L570 Actinic keratosis: Secondary | ICD-10-CM | POA: Diagnosis not present

## 2023-02-12 DIAGNOSIS — Z79899 Other long term (current) drug therapy: Secondary | ICD-10-CM | POA: Diagnosis not present

## 2023-02-13 DIAGNOSIS — M25561 Pain in right knee: Secondary | ICD-10-CM | POA: Diagnosis not present

## 2023-02-13 DIAGNOSIS — M25512 Pain in left shoulder: Secondary | ICD-10-CM | POA: Diagnosis not present

## 2023-02-14 DIAGNOSIS — M25512 Pain in left shoulder: Secondary | ICD-10-CM | POA: Diagnosis not present

## 2023-02-18 DIAGNOSIS — M25512 Pain in left shoulder: Secondary | ICD-10-CM | POA: Diagnosis not present

## 2023-02-19 DIAGNOSIS — M25512 Pain in left shoulder: Secondary | ICD-10-CM | POA: Diagnosis not present

## 2023-02-24 DIAGNOSIS — M25512 Pain in left shoulder: Secondary | ICD-10-CM | POA: Diagnosis not present

## 2023-02-25 ENCOUNTER — Ambulatory Visit (INDEPENDENT_AMBULATORY_CARE_PROVIDER_SITE_OTHER): Payer: Medicare Other | Admitting: *Deleted

## 2023-02-25 DIAGNOSIS — Z0000 Encounter for general adult medical examination without abnormal findings: Secondary | ICD-10-CM

## 2023-02-25 NOTE — Progress Notes (Signed)
Subjective:   Christy Hill is a 67 y.o. female who presents for an Initial Medicare Annual Wellness Visit.  Visit Complete: Virtual  I connected with  Christy Hill on 02/25/23 by a audio enabled telemedicine application and verified that I am speaking with the correct person using two identifiers.  Patient Location: Home  Provider Location: Office/Clinic  I discussed the limitations of evaluation and management by telemedicine. The patient expressed understanding and agreed to proceed.  Patient Medicare AWV questionnaire was completed by the patient on 02/24/23; I have confirmed that all information answered by patient is correct and no changes since this date.  Review of Systems     Cardiac Risk Factors include: advanced age (>61men, >85 women)     Objective:    Per patient no change in vitals since last visit; unable to obtain new vitals due to this being a telehealth visit.      02/25/2023    3:48 PM 12/13/2014    6:00 PM 12/08/2014   10:06 AM 03/29/2013    2:05 PM  Advanced Directives  Does Patient Have a Medical Advance Directive? Yes No No Patient has advance directive, copy not in chart  Type of Advance Directive Healthcare Power of [place];Living will   Living will  Does patient want to make changes to medical advance directive? No - Patient declined     Copy of Healthcare Power of Attorney in Chart? No - copy requested     Would patient like information on creating a medical advance directive?  No - patient declined information No - patient declined information   Pre-existing out of facility DNR order (yellow form or pink MOST form)    No    Current Medications (verified) Outpatient Encounter Medications as of 02/25/2023  Medication Sig   ARMOUR THYROID PO Take 0.25 mg by mouth daily.   Ascorbic Acid (VITAMIN C) 1000 MG tablet Take 1,000 mg by mouth 3 (three) times daily.   Cholecalciferol (VITAMIN D3) 5000 UNITS CAPS Take 1 capsule by mouth daily.   diazepam  (VALIUM) 2 MG tablet TAKE 1/2 TO 1 TABLET(1 TO 2 MG) BY MOUTH AT BEDTIME AS NEEDED   estradiol (DOTTI) 0.075 MG/24HR Place 1 patch onto the skin 2 (two) times a week.   Levomefolate Glucosamine (METHYL-FOLATE) 1700 MCG CAPS Take by mouth.   liothyronine (CYTOMEL) 5 MCG tablet TAKE 1 TABLET(5 MCG) BY MOUTH DAILY   Multiple Vitamins-Minerals (ZINC PO) Take 1 tablet by mouth daily. With copper   NALTREXONE HCL PO Take 4.5 mg by mouth daily. For auto immune issues   Nutritional Supplements (DHEA PO) Take 100 mg by mouth daily.   Omega-3 Fatty Acids (FISH OIL) 1000 MG CAPS Take 1 capsule by mouth 3 (three) times daily.   Probiotic Product (PROBIOTIC PO) Take 1 capsule by mouth daily.   progesterone (PROMETRIUM) 100 MG capsule Take 2 capsules (200 mg total) by mouth at bedtime.   Testosterone 10 MG/ACT (2%) GEL Place 0.25-0.5 mLs onto the skin at bedtime. Apply behind the knee   thyroid (ARMOUR) 90 MG tablet Take 90 mg by mouth daily.   triamcinolone cream (KENALOG) 0.1 % SMARTSIG:1 Application Topical 2-3 Times Daily   UNABLE TO FIND Med Name: MK 7 bone support   [DISCONTINUED] progesterone (PROMETRIUM) 100 MG capsule Take 400 mg by mouth at bedtime.   [DISCONTINUED] scopolamine (TRANSDERM-SCOP) 1 MG/3DAYS Place 1 patch (1.5 mg total) onto the skin every 3 (three) days.   [DISCONTINUED] thyroid (  ARMOUR THYROID) 60 MG tablet Take 1 tablet (60 mg total) by mouth daily before breakfast.   No facility-administered encounter medications on file as of 02/25/2023.    Allergies (verified) Influenza vaccines, Gluten meal, Milk (cow), Molds & smuts, Pollen extract, and Wheat   History: Past Medical History:  Diagnosis Date   Adverse effect of general anesthetic    pt has graves disase and needs eye ointment to keeps lubricated   Allergy    Anemia    currently taking iron   Asthma    no meds, years no problems   Atrial fibrillation (HCC)    secondary to thyroid   Depression    Fibromyalgia     Hyperthyroidism    Migraines    history   PONV (postoperative nausea and vomiting)    after all surgeries including colonoscopy   Thyroid disease    Past Surgical History:  Procedure Laterality Date   ABDOMINAL HYSTERECTOMY     APPENDECTOMY     BILATERAL SALPINGECTOMY Bilateral 12/13/2014   Procedure: BILATERAL SALPINGECTOMY;  Surgeon: Olivia Mackie, MD;  Location: [facility];  Service: Gynecology;  Laterality: Bilateral;   BREAST ENHANCEMENT SURGERY     BREAST IMPLANT REMOVAL     BUNIONECTOMY     CESAREAN SECTION     CHOLECYSTECTOMY     COLONOSCOPY     DILATATION & CURRETTAGE/HYSTEROSCOPY WITH RESECTOCOPE N/A 03/31/2013   Procedure: DILATATION & CURETTAGE/HYSTEROSCOPY WITH RESECTOCOPE;  Surgeon: Mitchel Honour, DO;  Location: [facility];  Service: Gynecology;  Laterality: N/A;   ENDOMETRIAL ABLATION     EYE SURGERY     KNEE ARTHROSCOPY     ROBOTIC ASSISTED TOTAL HYSTERECTOMY N/A 12/13/2014   Procedure: ROBOTIC ASSISTED TOTAL HYSTERECTOMY;  Surgeon: Olivia Mackie, MD;  Location: [facility];  Service: Gynecology;  Laterality: N/A;  2 1/2 hrs.   TUBAL LIGATION     Family History  Problem Relation Age of Onset   Depression Mother    Dementia Mother    Cancer Father    Dementia Father    Depression Sister    Depression Brother    Pernicious anemia Paternal Aunt    Pernicious anemia Paternal Grandmother    Alcohol abuse Neg Hx    Bipolar disorder Son    Social History   Socioeconomic History   Marital status: Married    Spouse name: Not on file   Number of children: Not on file   Years of education: Not on file   Highest education level: Not on file  Occupational History   Occupation: Enrolled Agent  Tobacco Use   Smoking status: Never   Smokeless tobacco: Never  Substance and Sexual Activity   Alcohol use: Yes    Alcohol/week: 1.0 - 2.0 standard drink of alcohol    Types: 1 - 2 Standard drinks or equivalent per week    Comment: HAD ETOH ON TUESDAY   Drug use: No   Sexual  activity: Yes  Other Topics Concern   Not on file  Social History Narrative   Not on file   Social Determinants of Health   Financial Resource Strain: Low Risk  (02/24/2023)   Overall Financial Resource Strain (CARDIA)    Difficulty of Paying Living Expenses: Not hard at all  Food Insecurity: No Food Insecurity (02/24/2023)   Hunger Vital Sign    Worried About Running Out of Food in the Last Year: Never true    Ran Out of Food in the Last Year: Never true  Transportation Needs: No Transportation Needs (02/24/2023)   PRAPARE - Administrator, Civil Service (Medical): No    Lack of Transportation (Non-Medical): No  Physical Activity: Sufficiently Active (02/24/2023)   Exercise Vital Sign    Days of Exercise per Week: 5 days    Minutes of Exercise per Session: 50 min  Stress: No Stress Concern Present (02/24/2023)   [employer] of Occupational Health - Occupational Stress Questionnaire    Feeling of Stress : Only a little  Social Connections: Unknown (02/24/2023)   Social Connection and Isolation Panel [NHANES]    Frequency of Communication with Friends and Family: More than three times a week    Frequency of Social Gatherings with Friends and Family: Twice a week    Attends Religious Services: Not on Marketing executive or Organizations: Yes    Attends Engineer, structural: More than 4 times per year    Marital Status: Married    Tobacco Counseling Counseling given: Not Answered   Clinical Intake:  Pre-visit preparation completed: Yes  Pain : No/denies pain  Nutritional Risks: None Diabetes: No  How often do you need to have someone help you when you read instructions, pamphlets, or other written materials from your doctor or pharmacy?: 1 - Never  Interpreter Needed?: No  Information entered by :: [employer], CMA   Activities of Daily Living    02/24/2023    8:56 PM  In your present state of health, do you have any difficulty  performing the following activities:  Hearing? 0  Vision? 0  Difficulty concentrating or making decisions? 0  Walking or climbing stairs? 0  Dressing or bathing? 0  Doing errands, shopping? 0  Preparing Food and eating ? N  Using the Toilet? N  In the past six months, have you accidently leaked urine? N  Do you have problems with loss of bowel control? N  Managing your Medications? N  Managing your Finances? N  Housekeeping or managing your Housekeeping? N    Patient Care Team: Copland, Gwenlyn Found, MD as PCP - General (Family Medicine)  Indicate any recent Medical Services you may have received from other than [facility] providers in the past year (date may be approximate).     Assessment:   This is a routine wellness examination for Clema.  Hearing/Vision screen No results found.  Dietary issues and exercise activities discussed:     Goals Addressed   None    Depression Screen    02/25/2023    3:47 PM 12/21/2020    8:18 AM 12/20/2015    9:37 AM 09/14/2013    8:25 AM  PHQ 2/9 Scores  PHQ - 2 Score 0 1 0 0    Fall Risk    02/24/2023    8:56 PM 12/21/2020    8:19 AM 12/20/2015    9:37 AM 09/14/2013    8:25 AM  Fall Risk   Falls in the past year? 0 0 No No  Number falls in past yr: 0     Injury with Fall? 0     Risk for fall due to : No Fall Risks     Follow up Falls evaluation completed       MEDICARE RISK AT HOME:   TIMED UP AND GO:  Was the test performed? No    Cognitive Function:        02/25/2023    3:49 PM  6CIT Screen  What Year?  0 points  What month? 0 points  What time? 0 points  Count back from 20 0 points  Months in reverse 0 points  Repeat phrase 0 points  Total Score 0 points    Immunizations Immunization History  Administered Date(s) Administered   Influenza,inj,Quad PF,6+ Mos 05/26/2014    TDAP status: Due, Education has been provided regarding the importance of this vaccine. Advised may receive this vaccine at local pharmacy or  Health Dept. Aware to provide a copy of the vaccination record if obtained from local pharmacy or Health Dept. Verbalized acceptance and understanding.  Flu Vaccine status: Up to date  Pneumococcal vaccine status: Due, Education has been provided regarding the importance of this vaccine. Advised may receive this vaccine at local pharmacy or Health Dept. Aware to provide a copy of the vaccination record if obtained from local pharmacy or Health Dept. Verbalized acceptance and understanding.  Covid-19 vaccine status: Information provided on how to obtain vaccines.   Qualifies for Shingles Vaccine? Yes   Zostavax completed No   Shingrix Completed?: No.    Education has been provided regarding the importance of this vaccine. Patient has been advised to call insurance company to determine out of pocket expense if they have not yet received this vaccine. Advised may also receive vaccine at local pharmacy or Health Dept. Verbalized acceptance and understanding.  Screening Tests Health Maintenance  Topic Date Due   DTaP/Tdap/Td (1 - Tdap) Never done   Zoster Vaccines- Shingrix (1 of 2) Never done   Pneumonia Vaccine 76+ Years old (1 of 1 - PCV) Never done   COVID-19 Vaccine (1 - 2023-24 season) Never done   Medicare Annual Wellness (AWV)  02/12/2023   INFLUENZA VACCINE  03/06/2023   MAMMOGRAM  12/19/2023   Fecal DNA (Cologuard)  02/19/2025   DEXA SCAN  Completed   Hepatitis C Screening  Completed   HPV VACCINES  Aged Out    Health Maintenance  Health Maintenance Due  Topic Date Due   DTaP/Tdap/Td (1 - Tdap) Never done   Zoster Vaccines- Shingrix (1 of 2) Never done   Pneumonia Vaccine 34+ Years old (1 of 1 - PCV) Never done   COVID-19 Vaccine (1 - 2023-24 season) Never done   Medicare Annual Wellness (AWV)  02/12/2023    Colorectal cancer screening: Type of screening: Cologuard. Completed 02/19/22. Repeat every 3 years  Mammogram status: Completed 12/19/22. Repeat every year  Bone  Density status: Completed 03/14/22. Results reflect: Bone density results: OSTEOPENIA. Repeat every 2 years.  Lung Cancer Screening: (Low Dose CT Chest recommended if Age 39-80 years, 20 pack-year currently smoking OR have quit w/in 15years.) does not qualify.   Additional Screening:  Hepatitis C Screening: does qualify; Completed 08/31/18  Vision Screening: Recommended annual ophthalmology exams for early detection of glaucoma and other disorders of the eye. Is the patient up to date with their annual eye exam?  Yes  Who is the provider or what is the name of the office in which the patient attends annual eye exams? [facility] If pt is not established with a provider, would they like to be referred to a provider to establish care? No .   Dental Screening: Recommended annual dental exams for proper oral hygiene  Diabetic Foot Exam: N/a  Community Resource Referral / Chronic Care Management: CRR required this visit?  No   CCM required this visit?  No     Plan:     I have personally reviewed and noted  the following in the patient's chart:   Medical and social history Use of alcohol, tobacco or illicit drugs  Current medications and supplements including opioid prescriptions. Patient is not currently taking opioid prescriptions. Functional ability and status Nutritional status Physical activity Advanced directives List of other physicians Hospitalizations, surgeries, and ER visits in previous 12 months Vitals Screenings to include cognitive, depression, and falls Referrals and appointments  In addition, I have reviewed and discussed with patient certain preventive protocols, quality metrics, and best practice recommendations. A written personalized care plan for preventive services as well as general preventive health recommendations were provided to patient.     Donne Anon, CMA   02/25/2023   After Visit Summary: (MyChart) Due to this being a telephonic visit, the  after visit summary with patients personalized plan was offered to patient via MyChart   Nurse Notes: None

## 2023-02-25 NOTE — Patient Instructions (Signed)
Christy Hill , Thank you for taking time to come for your Medicare Wellness Visit. I appreciate your ongoing commitment to your health goals. Please review the following plan we discussed and let me know if I can assist you in the future.   These are the goals we discussed:  Goals   None     This is a list of the screening recommended for you and due dates:  Health Maintenance  Topic Date Due   DTaP/Tdap/Td vaccine (1 - Tdap) Never done   Zoster (Shingles) Vaccine (1 of 2) Never done   Pneumonia Vaccine (1 of 1 - PCV) Never done   COVID-19 Vaccine (1 - 2023-24 season) Never done   Flu Shot  03/06/2023   Mammogram  12/19/2023   Medicare Annual Wellness Visit  02/25/2024   Cologuard (Stool DNA test)  02/19/2025   DEXA scan (bone density measurement)  Completed   Hepatitis C Screening  Completed   HPV Vaccine  Aged Out     Next appointment: Follow up in one year for your annual wellness visit.   Preventive Care 66 Years and Older, Female Preventive care refers to lifestyle choices and visits with your health care provider that can promote health and wellness. What does preventive care include? A yearly physical exam. This is also called an annual well check. Dental exams once or twice a year. Routine eye exams. Ask your health care provider how often you should have your eyes checked. Personal lifestyle choices, including: Daily care of your teeth and gums. Regular physical activity. Eating a healthy diet. Avoiding tobacco and drug use. Limiting alcohol use. Practicing safe sex. Taking low-dose aspirin every day. Taking vitamin and mineral supplements as recommended by your health care provider. What happens during an annual well check? The services and screenings done by your health care provider during your annual well check will depend on your age, overall health, lifestyle risk factors, and family history of disease. Counseling  Your health care provider may ask you  questions about your: Alcohol use. Tobacco use. Drug use. Emotional well-being. Home and relationship well-being. Sexual activity. Eating habits. History of falls. Memory and ability to understand (cognition). Work and work Astronomer. Reproductive health. Screening  You may have the following tests or measurements: Height, weight, and BMI. Blood pressure. Lipid and cholesterol levels. These may be checked every 5 years, or more frequently if you are over 54 years old. Skin check. Lung cancer screening. You may have this screening every year starting at age 46 if you have a 30-pack-year history of smoking and currently smoke or have quit within the past 15 years. Fecal occult blood test (FOBT) of the stool. You may have this test every year starting at age 75. Flexible sigmoidoscopy or colonoscopy. You may have a sigmoidoscopy every 5 years or a colonoscopy every 10 years starting at age 42. Hepatitis C blood test. Hepatitis B blood test. Sexually transmitted disease (STD) testing. Diabetes screening. This is done by checking your blood sugar (glucose) after you have not eaten for a while (fasting). You may have this done every 1-3 years. Bone density scan. This is done to screen for osteoporosis. You may have this done starting at age 7. Mammogram. This may be done every 1-2 years. Talk to your health care provider about how often you should have regular mammograms. Talk with your health care provider about your test results, treatment options, and if necessary, the need for more tests. Vaccines  Your health  care provider may recommend certain vaccines, such as: Influenza vaccine. This is recommended every year. Tetanus, diphtheria, and acellular pertussis (Tdap, Td) vaccine. You may need a Td booster every 10 years. Zoster vaccine. You may need this after age 10. Pneumococcal 13-valent conjugate (PCV13) vaccine. One dose is recommended after age 23. Pneumococcal polysaccharide  (PPSV23) vaccine. One dose is recommended after age 69. Talk to your health care provider about which screenings and vaccines you need and how often you need them. This information is not intended to replace advice given to you by your health care provider. Make sure you discuss any questions you have with your health care provider. Document Released: 08/18/2015 Document Revised: 04/10/2016 Document Reviewed: 05/23/2015 Elsevier Interactive Patient Education  2017 [employer].  Fall Prevention in the Home Falls can cause injuries. They can happen to people of all ages. There are many things you can do to make your home safe and to help prevent falls. What can I do on the outside of my home? Regularly fix the edges of walkways and driveways and fix any cracks. Remove anything that might make you trip as you walk through a door, such as a raised step or threshold. Trim any bushes or trees on the path to your home. Use bright outdoor lighting. Clear any walking paths of anything that might make someone trip, such as rocks or tools. Regularly check to see if handrails are loose or broken. Make sure that both sides of any steps have handrails. Any raised decks and porches should have guardrails on the edges. Have any leaves, snow, or ice cleared regularly. Use sand or salt on walking paths during winter. Clean up any spills in your garage right away. This includes oil or grease spills. What can I do in the bathroom? Use night lights. Install grab bars by the toilet and in the tub and shower. Do not use towel bars as grab bars. Use non-skid mats or decals in the tub or shower. If you need to sit down in the shower, use a plastic, non-slip stool. Keep the floor dry. Clean up any water that spills on the floor as soon as it happens. Remove soap buildup in the tub or shower regularly. Attach bath mats securely with double-sided non-slip rug tape. Do not have throw rugs and other things on the  floor that can make you trip. What can I do in the bedroom? Use night lights. Make sure that you have a light by your bed that is easy to reach. Do not use any sheets or blankets that are too big for your bed. They should not hang down onto the floor. Have a firm chair that has side arms. You can use this for support while you get dressed. Do not have throw rugs and other things on the floor that can make you trip. What can I do in the kitchen? Clean up any spills right away. Avoid walking on wet floors. Keep items that you use a lot in easy-to-reach places. If you need to reach something above you, use a strong step stool that has a grab bar. Keep electrical cords out of the way. Do not use floor polish or wax that makes floors slippery. If you must use wax, use non-skid floor wax. Do not have throw rugs and other things on the floor that can make you trip. What can I do with my stairs? Do not leave any items on the stairs. Make sure that there are handrails on  both sides of the stairs and use them. Fix handrails that are broken or loose. Make sure that handrails are as long as the stairways. Check any carpeting to make sure that it is firmly attached to the stairs. Fix any carpet that is loose or worn. Avoid having throw rugs at the top or bottom of the stairs. If you do have throw rugs, attach them to the floor with carpet tape. Make sure that you have a light switch at the top of the stairs and the bottom of the stairs. If you do not have them, ask someone to add them for you. What else can I do to help prevent falls? Wear shoes that: Do not have high heels. Have rubber bottoms. Are comfortable and fit you well. Are closed at the toe. Do not wear sandals. If you use a stepladder: Make sure that it is fully opened. Do not climb a closed stepladder. Make sure that both sides of the stepladder are locked into place. Ask someone to hold it for you, if possible. Clearly mark and make  sure that you can see: Any grab bars or handrails. First and last steps. Where the edge of each step is. Use tools that help you move around (mobility aids) if they are needed. These include: Canes. Walkers. Scooters. Crutches. Turn on the lights when you go into a dark area. Replace any light bulbs as soon as they burn out. Set up your furniture so you have a clear path. Avoid moving your furniture around. If any of your floors are uneven, fix them. If there are any pets around you, be aware of where they are. Review your medicines with your doctor. Some medicines can make you feel dizzy. This can increase your chance of falling. Ask your doctor what other things that you can do to help prevent falls. This information is not intended to replace advice given to you by your health care provider. Make sure you discuss any questions you have with your health care provider. Document Released: 05/18/2009 Document Revised: 12/28/2015 Document Reviewed: 08/26/2014 Elsevier Interactive Patient Education  2017 [employer].

## 2023-03-04 DIAGNOSIS — M25512 Pain in left shoulder: Secondary | ICD-10-CM | POA: Diagnosis not present

## 2023-03-05 DIAGNOSIS — K080 Exfoliation of teeth due to systemic causes: Secondary | ICD-10-CM | POA: Diagnosis not present

## 2023-03-06 DIAGNOSIS — M25512 Pain in left shoulder: Secondary | ICD-10-CM | POA: Diagnosis not present

## 2023-03-10 DIAGNOSIS — K080 Exfoliation of teeth due to systemic causes: Secondary | ICD-10-CM | POA: Diagnosis not present

## 2023-03-10 DIAGNOSIS — M25512 Pain in left shoulder: Secondary | ICD-10-CM | POA: Diagnosis not present

## 2023-03-11 DIAGNOSIS — S83281D Other tear of lateral meniscus, current injury, right knee, subsequent encounter: Secondary | ICD-10-CM | POA: Diagnosis not present

## 2023-03-12 DIAGNOSIS — M25512 Pain in left shoulder: Secondary | ICD-10-CM | POA: Diagnosis not present

## 2023-03-17 ENCOUNTER — Encounter: Payer: Self-pay | Admitting: Family Medicine

## 2023-03-17 DIAGNOSIS — M25512 Pain in left shoulder: Secondary | ICD-10-CM | POA: Diagnosis not present

## 2023-03-18 NOTE — Progress Notes (Unsigned)
[facility] [address] [place], [place] [address] [phone]  Date:  03/19/2023   Name:  Christy Hill   DOB:  November 29, 1955   MRN:  [id number]  PCP:  Pearline Cables, MD    Chief Complaint: No chief complaint on file.   History of Present Illness:  Christy Hill is a 67 y.o. very pleasant female patient who presents with the following:  Patient seen today for physical exam Most recent visit with myself was July of last year, although we have exchanged approximately 20 MyChart messages since that time  History of atrial fib, hyperthyroidism/ Graves disease s/p thyroidectomy.  No longer taking flecainide.  At her visit last year she noted no evidence of atrial fib for about a year  Her cardiologist is with [facility]-it looks like her last visit was about a year ago  Status post hysterectomy for benign disease [facility] is managing her thyroid She is on HRT with estradiol patch and progesterone by mouth daily  Has previously declined immunizations She had a positive Cologuard last year and underwent colonoscopy in September per Dr. Elnoria Howard, who removed 1 large polyp and recommended to recheck in 1 year  She also had a mammogram in May of this year: IMPRESSION: 1. Likely benign cysts/fibrocystic changes at the 6 o'clock position of the RIGHT breast 1 cm from the nipple and at the 10 o'clock position of the LEFT breast 2 cm from the nipple. Six-month follow-up recommended to ensure stability. 2. Additional benign simple cyst in the UPPER INNER LEFT breast. 3. No findings suspicious for breast malignancy.   RECOMMENDATION: Bilateral breast ultrasound in 6 months. Patient Active Problem List   Diagnosis Date Noted   Osteopenia 03/14/2022   Dysmenorrhea 12/13/2014   Graves' ophthalmopathy 09/14/2013   Synovitis of finger 09/14/2013   Thyroid eye disease 04/05/2013   Vaginal spotting 12/10/2012    Hyperthyroidism 06/22/2012   History of fibromyalgia 06/21/2012   Allergic rhinitis 06/21/2012   Menopause 06/11/2012   History of migraine headaches 06/11/2012   Depression 06/11/2012   S/P cholecystectomy 06/11/2012   S/P exploratory laparotomy 06/11/2012   New onset atrial fibrillation (HCC) 06/11/2012    Past Medical History:  Diagnosis Date   Adverse effect of general anesthetic    pt has graves disase and needs eye ointment to keeps lubricated   Allergy    Anemia    currently taking iron   Asthma    no meds, years no problems   Atrial fibrillation (HCC)    secondary to thyroid   Depression    Fibromyalgia    Hyperthyroidism    Migraines    history   PONV (postoperative nausea and vomiting)    after all surgeries including colonoscopy   Thyroid disease     Past Surgical History:  Procedure Laterality Date   ABDOMINAL HYSTERECTOMY     APPENDECTOMY     BILATERAL SALPINGECTOMY Bilateral 12/13/2014   Procedure: BILATERAL SALPINGECTOMY;  Surgeon: Olivia Mackie, MD;  Location: [facility];  Service: Gynecology;  Laterality: Bilateral;   BREAST ENHANCEMENT SURGERY     BREAST IMPLANT REMOVAL     BUNIONECTOMY     CESAREAN SECTION     CHOLECYSTECTOMY     COLONOSCOPY     DILATATION & CURRETTAGE/HYSTEROSCOPY WITH RESECTOCOPE N/A 03/31/2013   Procedure: DILATATION & CURETTAGE/HYSTEROSCOPY WITH RESECTOCOPE;  Surgeon: Mitchel Honour, DO;  Location: [facility];  Service: Gynecology;  Laterality: N/A;   ENDOMETRIAL ABLATION     EYE SURGERY     KNEE ARTHROSCOPY     ROBOTIC ASSISTED TOTAL HYSTERECTOMY N/A 12/13/2014   Procedure: ROBOTIC ASSISTED TOTAL HYSTERECTOMY;  Surgeon: Olivia Mackie, MD;  Location: [facility];  Service: Gynecology;  Laterality: N/A;  2 1/2 hrs.   TUBAL LIGATION      Social History   Tobacco Use   Smoking status: Never   Smokeless tobacco: Never  Substance Use Topics   Alcohol use: Yes    Alcohol/week: 1.0 - 2.0 standard drink of alcohol    Types: 1 - 2  Standard drinks or equivalent per week    Comment: HAD ETOH ON TUESDAY   Drug use: No    Family History  Problem Relation Age of Onset   Depression Mother    Dementia Mother    Cancer Father    Dementia Father    Depression Sister    Depression Brother    Pernicious anemia Paternal Aunt    Pernicious anemia Paternal Grandmother    Alcohol abuse Neg Hx    Bipolar disorder Son     Allergies  Allergen Reactions   Influenza Vaccines Other (See Comments)    AFIB   Gluten Meal    Milk (Cow) Nausea Only    Cow milk only   Molds & Smuts Other (See Comments)    Sick to stomach - headache   Pollen Extract Other (See Comments)    sneezing   Wheat Other (See Comments)    Medication list has been reviewed and updated.  Current Outpatient Medications on File Prior to Visit  Medication Sig Dispense Refill   ARMOUR THYROID PO Take 0.25 mg by mouth daily.     Ascorbic Acid (VITAMIN C) 1000 MG tablet Take 1,000 mg by mouth 3 (three) times daily.     Cholecalciferol (VITAMIN D3) 5000 UNITS CAPS Take 1 capsule by mouth daily.     diazepam (VALIUM) 2 MG tablet TAKE 1/2 TO 1 TABLET(1 TO 2 MG) BY MOUTH AT BEDTIME AS NEEDED 30 tablet 0   estradiol (DOTTI) 0.075 MG/24HR Place 1 patch onto the skin 2 (two) times a week. 24 patch 3   Levomefolate Glucosamine (METHYL-FOLATE) 1700 MCG CAPS Take by mouth.     liothyronine (CYTOMEL) 5 MCG tablet TAKE 1 TABLET(5 MCG) BY MOUTH DAILY 90 tablet 0   Multiple Vitamins-Minerals (ZINC PO) Take 1 tablet by mouth daily. With copper     NALTREXONE HCL PO Take 4.5 mg by mouth daily. For auto immune issues     Nutritional Supplements (DHEA PO) Take 100 mg by mouth daily.     Omega-3 Fatty Acids (FISH OIL) 1000 MG CAPS Take 1 capsule by mouth 3 (three) times daily.     Probiotic Product (PROBIOTIC PO) Take 1 capsule by mouth daily.     progesterone (PROMETRIUM) 100 MG capsule Take 2 capsules (200 mg total) by mouth at bedtime. 180 capsule 3   Testosterone 10  MG/ACT (2%) GEL Place 0.25-0.5 mLs onto the skin at bedtime. Apply behind the knee     thyroid (ARMOUR) 90 MG tablet Take 90 mg by mouth daily.     triamcinolone cream (KENALOG) 0.1 % SMARTSIG:1 Application Topical 2-3 Times Daily     UNABLE TO FIND Med Name: MK 7 bone support     No current facility-administered medications on file prior to visit.    Review of Systems:  As per HPI- otherwise negative.  Physical Examination: There were no vitals filed for this visit. There were no vitals filed for this visit. There is no height or weight on file to calculate BMI. Ideal Body Weight:    GEN: no acute distress. HEENT: Atraumatic, Normocephalic.  Ears and Nose: No external deformity. CV: RRR, No M/G/R. No JVD. No thrill. No extra heart sounds. PULM: CTA B, no wheezes, crackles, rhonchi. No retractions. No resp. distress. No accessory muscle use. ABD: S, NT, ND, +BS. No rebound. No HSM. EXTR: No c/c/e PSYCH: Normally interactive. Conversant.    Assessment and Plan: *** Physical exam today.  Encouraged healthy diet and exercise routine Signed Abbe Amsterdam, MD

## 2023-03-19 ENCOUNTER — Ambulatory Visit (INDEPENDENT_AMBULATORY_CARE_PROVIDER_SITE_OTHER): Payer: Medicare Other | Admitting: Family Medicine

## 2023-03-19 ENCOUNTER — Encounter: Payer: Self-pay | Admitting: Family Medicine

## 2023-03-19 VITALS — BP 104/60 | HR 60 | Temp 97.9°F | Resp 18 | Ht 66.5 in | Wt 137.2 lb

## 2023-03-19 DIAGNOSIS — E039 Hypothyroidism, unspecified: Secondary | ICD-10-CM | POA: Diagnosis not present

## 2023-03-19 DIAGNOSIS — Z130 Encounter for screening for diseases of the blood and blood-forming organs and certain disorders involving the immune mechanism: Secondary | ICD-10-CM

## 2023-03-19 DIAGNOSIS — Z0000 Encounter for general adult medical examination without abnormal findings: Secondary | ICD-10-CM

## 2023-03-19 DIAGNOSIS — Z79890 Hormone replacement therapy (postmenopausal): Secondary | ICD-10-CM

## 2023-03-19 DIAGNOSIS — Z131 Encounter for screening for diabetes mellitus: Secondary | ICD-10-CM

## 2023-03-19 DIAGNOSIS — R5383 Other fatigue: Secondary | ICD-10-CM | POA: Diagnosis not present

## 2023-03-19 DIAGNOSIS — Z13220 Encounter for screening for lipoid disorders: Secondary | ICD-10-CM | POA: Diagnosis not present

## 2023-03-19 DIAGNOSIS — N951 Menopausal and female climacteric states: Secondary | ICD-10-CM

## 2023-03-19 MED ORDER — PROGESTERONE MICRONIZED 100 MG PO CAPS: 200.0000 mg | ORAL_CAPSULE | Freq: Every day | ORAL | 3 refills | Status: DC

## 2023-03-19 NOTE — Patient Instructions (Addendum)
It was good to see you today- I will be in touch with your labs asap  I hope that you are feeling much better soon!

## 2023-03-20 ENCOUNTER — Encounter: Payer: Self-pay | Admitting: Family Medicine

## 2023-03-20 DIAGNOSIS — M25512 Pain in left shoulder: Secondary | ICD-10-CM | POA: Diagnosis not present

## 2023-03-20 LAB — COMPREHENSIVE METABOLIC PANEL
ALT: 14 U/L (ref 0–35)
AST: 21 U/L (ref 0–37)
Albumin: 4.5 g/dL (ref 3.5–5.2)
Alkaline Phosphatase: 62 U/L (ref 39–117)
BUN: 18 mg/dL (ref 6–23)
CO2: 29 meq/L (ref 19–32)
Calcium: 9.9 mg/dL (ref 8.4–10.5)
Chloride: 99 meq/L (ref 96–112)
Creatinine, Ser: 0.84 mg/dL (ref 0.40–1.20)
GFR: 72.59 mL/min (ref >60.00)
Glucose, Bld: 83 mg/dL (ref 70–99)
Potassium: 4.4 meq/L (ref 3.5–5.1)
Sodium: 135 meq/L (ref 135–145)
Total Bilirubin: 0.5 mg/dL (ref 0.2–1.2)
Total Protein: 6.8 g/dL (ref 6.0–8.3)

## 2023-03-20 LAB — CBC
HCT: 44.7 % (ref 36.0–46.0)
Hemoglobin: 14.6 g/dL (ref 12.0–15.0)
MCHC: 32.6 g/dL (ref 30.0–36.0)
MCV: 95.9 fl (ref 78.0–100.0)
Platelets: 266 10*3/uL (ref 150.0–400.0)
RBC: 4.66 Mil/uL (ref 3.87–5.11)
RDW: 13.5 % (ref 11.5–15.5)
WBC: 7.8 10*3/uL (ref 4.0–10.5)

## 2023-03-20 LAB — LIPID PANEL
Cholesterol: 198 mg/dL (ref 0–200)
HDL: 66.2 mg/dL (ref >39.00)
LDL Cholesterol: 114 mg/dL — ABNORMAL HIGH (ref 0–99)
NonHDL: 131.38
Total CHOL/HDL Ratio: 3
Triglycerides: 86 mg/dL (ref 0.0–149.0)
VLDL: 17.2 mg/dL (ref 0.0–40.0)

## 2023-03-20 LAB — TSH: TSH: 1.1 u[IU]/mL (ref 0.35–5.50)

## 2023-03-20 LAB — PROGESTERONE: Progesterone: 11 ng/mL

## 2023-03-20 LAB — T3, FREE: T3, Free: 3.6 pg/mL (ref 2.3–4.2)

## 2023-03-20 LAB — VITAMIN D 25 HYDROXY (VIT D DEFICIENCY, FRACTURES): VITD: 88.76 ng/mL (ref 30.00–100.00)

## 2023-03-20 LAB — TESTOSTERONE: Testosterone: 65 ng/dL

## 2023-03-20 LAB — FOLLICLE STIMULATING HORMONE: FSH: 77.9 m[IU]/mL

## 2023-03-20 LAB — HEMOGLOBIN A1C: Hgb A1c MFr Bld: 5.4 % (ref 4.6–6.5)

## 2023-03-24 DIAGNOSIS — M25512 Pain in left shoulder: Secondary | ICD-10-CM | POA: Diagnosis not present

## 2023-04-02 DIAGNOSIS — S83281D Other tear of lateral meniscus, current injury, right knee, subsequent encounter: Secondary | ICD-10-CM | POA: Diagnosis not present

## 2023-04-14 DIAGNOSIS — S83271A Complex tear of lateral meniscus, current injury, right knee, initial encounter: Secondary | ICD-10-CM | POA: Diagnosis not present

## 2023-04-14 DIAGNOSIS — G8918 Other acute postprocedural pain: Secondary | ICD-10-CM | POA: Diagnosis not present

## 2023-04-14 DIAGNOSIS — S83231A Complex tear of medial meniscus, current injury, right knee, initial encounter: Secondary | ICD-10-CM | POA: Diagnosis not present

## 2023-04-21 DIAGNOSIS — Z1211 Encounter for screening for malignant neoplasm of colon: Secondary | ICD-10-CM | POA: Diagnosis not present

## 2023-05-10 ENCOUNTER — Other Ambulatory Visit: Payer: Self-pay | Admitting: Family Medicine

## 2023-05-28 DIAGNOSIS — M25512 Pain in left shoulder: Secondary | ICD-10-CM | POA: Diagnosis not present

## 2023-06-16 DIAGNOSIS — M25512 Pain in left shoulder: Secondary | ICD-10-CM | POA: Diagnosis not present

## 2023-06-17 DIAGNOSIS — Z860100 Personal history of colon polyps, unspecified: Secondary | ICD-10-CM | POA: Diagnosis not present

## 2023-06-17 DIAGNOSIS — Z860101 Personal history of adenomatous and serrated colon polyps: Secondary | ICD-10-CM | POA: Diagnosis not present

## 2023-06-17 DIAGNOSIS — K6389 Other specified diseases of intestine: Secondary | ICD-10-CM | POA: Diagnosis not present

## 2023-06-17 DIAGNOSIS — K635 Polyp of colon: Secondary | ICD-10-CM | POA: Diagnosis not present

## 2023-06-17 DIAGNOSIS — D122 Benign neoplasm of ascending colon: Secondary | ICD-10-CM | POA: Diagnosis not present

## 2023-06-17 LAB — HM COLONOSCOPY

## 2023-06-23 ENCOUNTER — Ambulatory Visit
Admission: RE | Admit: 2023-06-23 | Discharge: 2023-06-23 | Disposition: A | Discharge status: Home / Self Care | Payer: Medicare Other | Source: Ambulatory Visit | Attending: Family Medicine | Admitting: Family Medicine

## 2023-06-23 ENCOUNTER — Other Ambulatory Visit: Payer: Self-pay | Admitting: Family Medicine

## 2023-06-23 DIAGNOSIS — N6310 Unspecified lump in the right breast, unspecified quadrant: Secondary | ICD-10-CM

## 2023-06-23 DIAGNOSIS — N6322 Unspecified lump in the left breast, upper inner quadrant: Secondary | ICD-10-CM | POA: Diagnosis not present

## 2023-06-23 DIAGNOSIS — N6320 Unspecified lump in the left breast, unspecified quadrant: Secondary | ICD-10-CM

## 2023-06-23 DIAGNOSIS — F5102 Adjustment insomnia: Secondary | ICD-10-CM

## 2023-06-23 DIAGNOSIS — N6315 Unspecified lump in the right breast, overlapping quadrants: Secondary | ICD-10-CM | POA: Diagnosis not present

## 2023-06-23 NOTE — Telephone Encounter (Signed)
Requesting: Valium 2mg  Contract: N/A UDS: N/A Last Visit: 01/16/2023 Next Visit: N/A Last Refill: 10/11/2022  Please Advise

## 2023-06-24 ENCOUNTER — Telehealth: Payer: Self-pay

## 2023-06-24 DIAGNOSIS — M25512 Pain in left shoulder: Secondary | ICD-10-CM | POA: Diagnosis not present

## 2023-06-24 NOTE — Telephone Encounter (Signed)
PA initiated via Covermymeds; KEY: [id number]. Awaiting determination.

## 2023-06-25 NOTE — Telephone Encounter (Signed)
PA denied.   Denied. We denied this request under Medicare Part D because Diazepam is not being prescribed for an FDA labeled or medically accepted use. A medically accepted use is approved by the FDA or supported by the [facility] Formulary Service Drug Information and the DRUGDEX Information System. In this case, Diazepam is not being prescribed in accordance with an FDA labeled use or use accepted by the Medicare approved drug compendia.

## 2023-06-26 ENCOUNTER — Encounter: Payer: Self-pay | Admitting: Family Medicine

## 2023-07-16 DIAGNOSIS — M720 Palmar fascial fibromatosis [Dupuytren]: Secondary | ICD-10-CM | POA: Diagnosis not present

## 2023-07-16 DIAGNOSIS — M79642 Pain in left hand: Secondary | ICD-10-CM | POA: Diagnosis not present

## 2023-07-25 ENCOUNTER — Other Ambulatory Visit: Payer: Self-pay | Admitting: Family Medicine

## 2023-08-11 DIAGNOSIS — M720 Palmar fascial fibromatosis [Dupuytren]: Secondary | ICD-10-CM | POA: Diagnosis not present

## 2023-08-14 DIAGNOSIS — M720 Palmar fascial fibromatosis [Dupuytren]: Secondary | ICD-10-CM | POA: Diagnosis not present

## 2023-08-14 DIAGNOSIS — M25641 Stiffness of right hand, not elsewhere classified: Secondary | ICD-10-CM | POA: Diagnosis not present

## 2023-08-18 DIAGNOSIS — M25641 Stiffness of right hand, not elsewhere classified: Secondary | ICD-10-CM | POA: Diagnosis not present

## 2023-08-28 DIAGNOSIS — M25641 Stiffness of right hand, not elsewhere classified: Secondary | ICD-10-CM | POA: Diagnosis not present

## 2023-08-28 DIAGNOSIS — M720 Palmar fascial fibromatosis [Dupuytren]: Secondary | ICD-10-CM | POA: Diagnosis not present

## 2023-09-05 DIAGNOSIS — M25641 Stiffness of right hand, not elsewhere classified: Secondary | ICD-10-CM | POA: Diagnosis not present

## 2023-09-15 DIAGNOSIS — M25641 Stiffness of right hand, not elsewhere classified: Secondary | ICD-10-CM | POA: Diagnosis not present

## 2023-09-17 DIAGNOSIS — M25562 Pain in left knee: Secondary | ICD-10-CM | POA: Diagnosis not present

## 2023-09-17 DIAGNOSIS — Z9889 Other specified postprocedural states: Secondary | ICD-10-CM | POA: Diagnosis not present

## 2023-09-17 DIAGNOSIS — M25561 Pain in right knee: Secondary | ICD-10-CM | POA: Diagnosis not present

## 2023-09-24 DIAGNOSIS — M25512 Pain in left shoulder: Secondary | ICD-10-CM | POA: Diagnosis not present

## 2023-09-24 DIAGNOSIS — K080 Exfoliation of teeth due to systemic causes: Secondary | ICD-10-CM | POA: Diagnosis not present

## 2023-09-26 DIAGNOSIS — M25641 Stiffness of right hand, not elsewhere classified: Secondary | ICD-10-CM | POA: Diagnosis not present

## 2023-10-08 DIAGNOSIS — M25561 Pain in right knee: Secondary | ICD-10-CM | POA: Diagnosis not present

## 2023-10-10 DIAGNOSIS — M25641 Stiffness of right hand, not elsewhere classified: Secondary | ICD-10-CM | POA: Diagnosis not present

## 2023-10-14 DIAGNOSIS — M25561 Pain in right knee: Secondary | ICD-10-CM | POA: Diagnosis not present

## 2023-10-17 DIAGNOSIS — M25561 Pain in right knee: Secondary | ICD-10-CM | POA: Diagnosis not present

## 2023-10-20 DIAGNOSIS — M25561 Pain in right knee: Secondary | ICD-10-CM | POA: Diagnosis not present

## 2023-10-23 DIAGNOSIS — M25561 Pain in right knee: Secondary | ICD-10-CM | POA: Diagnosis not present

## 2023-10-23 DIAGNOSIS — I480 Paroxysmal atrial fibrillation: Secondary | ICD-10-CM | POA: Diagnosis not present

## 2023-10-29 DIAGNOSIS — I480 Paroxysmal atrial fibrillation: Secondary | ICD-10-CM | POA: Diagnosis not present

## 2023-10-29 DIAGNOSIS — M25561 Pain in right knee: Secondary | ICD-10-CM | POA: Diagnosis not present

## 2023-10-30 DIAGNOSIS — K080 Exfoliation of teeth due to systemic causes: Secondary | ICD-10-CM | POA: Diagnosis not present

## 2023-10-31 DIAGNOSIS — R0609 Other forms of dyspnea: Secondary | ICD-10-CM | POA: Diagnosis not present

## 2023-10-31 DIAGNOSIS — E0590 Thyrotoxicosis, unspecified without thyrotoxic crisis or storm: Secondary | ICD-10-CM | POA: Diagnosis not present

## 2023-10-31 DIAGNOSIS — Z79899 Other long term (current) drug therapy: Secondary | ICD-10-CM | POA: Diagnosis not present

## 2023-10-31 DIAGNOSIS — I480 Paroxysmal atrial fibrillation: Secondary | ICD-10-CM | POA: Diagnosis not present

## 2023-10-31 DIAGNOSIS — E039 Hypothyroidism, unspecified: Secondary | ICD-10-CM | POA: Diagnosis not present

## 2023-10-31 DIAGNOSIS — N189 Chronic kidney disease, unspecified: Secondary | ICD-10-CM | POA: Diagnosis not present

## 2023-11-03 DIAGNOSIS — H31091 Other chorioretinal scars, right eye: Secondary | ICD-10-CM | POA: Diagnosis not present

## 2023-11-03 DIAGNOSIS — H43811 Vitreous degeneration, right eye: Secondary | ICD-10-CM | POA: Diagnosis not present

## 2023-11-03 DIAGNOSIS — H3562 Retinal hemorrhage, left eye: Secondary | ICD-10-CM | POA: Diagnosis not present

## 2023-11-03 DIAGNOSIS — H43391 Other vitreous opacities, right eye: Secondary | ICD-10-CM | POA: Diagnosis not present

## 2023-11-11 DIAGNOSIS — M25561 Pain in right knee: Secondary | ICD-10-CM | POA: Diagnosis not present

## 2023-11-11 DIAGNOSIS — I480 Paroxysmal atrial fibrillation: Secondary | ICD-10-CM | POA: Diagnosis not present

## 2023-11-12 ENCOUNTER — Encounter: Payer: Self-pay | Admitting: Family Medicine

## 2023-11-13 DIAGNOSIS — M25561 Pain in right knee: Secondary | ICD-10-CM | POA: Diagnosis not present

## 2023-11-13 DIAGNOSIS — M7502 Adhesive capsulitis of left shoulder: Secondary | ICD-10-CM | POA: Diagnosis not present

## 2023-11-14 DIAGNOSIS — M25561 Pain in right knee: Secondary | ICD-10-CM | POA: Diagnosis not present

## 2023-11-17 NOTE — Telephone Encounter (Signed)
 I received the forms, completed and faxed back to [facility].

## 2023-11-18 ENCOUNTER — Other Ambulatory Visit: Payer: Self-pay

## 2023-11-18 ENCOUNTER — Telehealth: Payer: Self-pay

## 2023-11-18 DIAGNOSIS — M25561 Pain in right knee: Secondary | ICD-10-CM | POA: Diagnosis not present

## 2023-11-18 DIAGNOSIS — R531 Weakness: Secondary | ICD-10-CM | POA: Diagnosis not present

## 2023-11-18 DIAGNOSIS — M25512 Pain in left shoulder: Secondary | ICD-10-CM | POA: Diagnosis not present

## 2023-11-18 NOTE — Telephone Encounter (Signed)
 Copied from CRM [id number]. Topic: Clinical - Prescription Issue >> Nov 18, 2023  1:09 PM Adaysia C wrote: Reason for CRM: Patients insurance called to inform the provider that the patient has to try estradiol (DOTTI) before they will approve the other medication; Patient has been notified of this information by her insurance; please follow up with patient or patients pharmacy

## 2023-11-19 NOTE — Telephone Encounter (Signed)
 Copied from CRM [id number]. Topic: General - Other >> Nov 19, 2023  8:49 AM Turkey A wrote: Reason for CRM: Elma Hacker with [employer] called to inform office that they are working on appeal for patient. Contact number is  [phone]

## 2023-11-19 NOTE — Telephone Encounter (Signed)
 Noted.

## 2023-11-20 DIAGNOSIS — M25561 Pain in right knee: Secondary | ICD-10-CM | POA: Diagnosis not present

## 2023-11-21 DIAGNOSIS — M25512 Pain in left shoulder: Secondary | ICD-10-CM | POA: Diagnosis not present

## 2023-11-21 DIAGNOSIS — R531 Weakness: Secondary | ICD-10-CM | POA: Diagnosis not present

## 2023-11-24 NOTE — Telephone Encounter (Signed)
 Copied from CRM [id number]. Topic: Clinical - Prescription Issue >> Nov 21, 2023  9:15 AM Albertha Alosa wrote: Reason for CRM: Earvin Goldberg from [employer] called regarding additional questions about the prescription  estradiol  (DOTTI ) 0.075 MG/24HR    [phone]

## 2023-11-24 NOTE — Telephone Encounter (Signed)
 Returned call Earvin Goldberg. Additional questions were answered by provider.

## 2023-11-24 NOTE — Telephone Encounter (Signed)
 Copied from CRM [id number]. Topic: General - Other >> Nov 24, 2023 11:09 AM Kita Perish H wrote: Reason for CRM: Earvin Goldberg with [employer] is working on an appeal for patient and has some additional clinical questions that needs answering before she can make a decision, please reach out, thanks.  [place] [employer] [phone] in office until 4:00 pm EST

## 2023-11-25 DIAGNOSIS — R531 Weakness: Secondary | ICD-10-CM | POA: Diagnosis not present

## 2023-11-25 DIAGNOSIS — M25512 Pain in left shoulder: Secondary | ICD-10-CM | POA: Diagnosis not present

## 2023-11-25 NOTE — Telephone Encounter (Signed)
 Dotto 0.075 mg has been approved through 11/23/2024.

## 2023-11-26 DIAGNOSIS — M25561 Pain in right knee: Secondary | ICD-10-CM | POA: Diagnosis not present

## 2023-11-28 DIAGNOSIS — M25561 Pain in right knee: Secondary | ICD-10-CM | POA: Diagnosis not present

## 2023-12-01 DIAGNOSIS — M25561 Pain in right knee: Secondary | ICD-10-CM | POA: Diagnosis not present

## 2023-12-02 DIAGNOSIS — H43811 Vitreous degeneration, right eye: Secondary | ICD-10-CM | POA: Diagnosis not present

## 2023-12-02 DIAGNOSIS — H3562 Retinal hemorrhage, left eye: Secondary | ICD-10-CM | POA: Diagnosis not present

## 2023-12-02 DIAGNOSIS — R531 Weakness: Secondary | ICD-10-CM | POA: Diagnosis not present

## 2023-12-02 DIAGNOSIS — H43391 Other vitreous opacities, right eye: Secondary | ICD-10-CM | POA: Diagnosis not present

## 2023-12-02 DIAGNOSIS — I480 Paroxysmal atrial fibrillation: Secondary | ICD-10-CM | POA: Diagnosis not present

## 2023-12-02 DIAGNOSIS — M25512 Pain in left shoulder: Secondary | ICD-10-CM | POA: Diagnosis not present

## 2023-12-02 DIAGNOSIS — H31091 Other chorioretinal scars, right eye: Secondary | ICD-10-CM | POA: Diagnosis not present

## 2023-12-04 DIAGNOSIS — R531 Weakness: Secondary | ICD-10-CM | POA: Diagnosis not present

## 2023-12-04 DIAGNOSIS — M25512 Pain in left shoulder: Secondary | ICD-10-CM | POA: Diagnosis not present

## 2023-12-09 DIAGNOSIS — R531 Weakness: Secondary | ICD-10-CM | POA: Diagnosis not present

## 2023-12-09 DIAGNOSIS — M25512 Pain in left shoulder: Secondary | ICD-10-CM | POA: Diagnosis not present

## 2023-12-11 ENCOUNTER — Encounter (HOSPITAL_COMMUNITY): Payer: Self-pay

## 2023-12-11 DIAGNOSIS — R531 Weakness: Secondary | ICD-10-CM | POA: Diagnosis not present

## 2023-12-11 DIAGNOSIS — M25512 Pain in left shoulder: Secondary | ICD-10-CM | POA: Diagnosis not present

## 2023-12-13 ENCOUNTER — Other Ambulatory Visit: Payer: Self-pay | Admitting: Medical Genetics

## 2023-12-16 DIAGNOSIS — R531 Weakness: Secondary | ICD-10-CM | POA: Diagnosis not present

## 2023-12-16 DIAGNOSIS — M25512 Pain in left shoulder: Secondary | ICD-10-CM | POA: Diagnosis not present

## 2023-12-16 DIAGNOSIS — I480 Paroxysmal atrial fibrillation: Secondary | ICD-10-CM | POA: Diagnosis not present

## 2023-12-16 NOTE — Progress Notes (Unsigned)
 [facility] [address] [place], [place] [address] [phone]  Date:  12/18/2023   Name:  Christy Hill   DOB:  01/07/1956   MRN:  [id number]  PCP:  Kaylee Partridge, MD    Chief Complaint: No chief complaint on file.   History of Present Illness:  Christy Hill is a 68 y.o. very pleasant female patient who presents with the following:  Patient seen today with concern about a vein Most recent visit with myself was August of last year for her physical History of atrial fib, hyperthyroidism/ Graves disease s/p thyroidectomy.  No longer taking flecainide .  Status post hysterectomy for benign disease [facility] is managing her thyroid  She is on HRT with estradiol  patch and progesterone  by mouth daily  She has tended to decline immunizations  She broke out in a rash on her bilateral shins about 3 weeks ago- she is not sure what might have caused it Sometimes itchy- but this is not a major feature Does not hurt or sting Not anywhere else on her body   She is not aware of any exposure to plants- she does not generally work out in the garden and she does not have pets No new clothes or other new  She has tried a Dealer    Patient Active Problem List   Diagnosis Date Noted   Osteopenia 03/14/2022   Dysmenorrhea 12/13/2014   Graves' ophthalmopathy 09/14/2013   Synovitis of finger 09/14/2013   Thyroid  eye disease 04/05/2013   Vaginal spotting 12/10/2012   Hyperthyroidism 06/22/2012   History of fibromyalgia 06/21/2012   Allergic rhinitis 06/21/2012   Menopause 06/11/2012   History of migraine headaches 06/11/2012   Depression 06/11/2012   S/P cholecystectomy 06/11/2012   S/P exploratory laparotomy 06/11/2012   New onset atrial fibrillation (HCC) 06/11/2012    Past Medical History:  Diagnosis Date   Adverse effect of general anesthetic    pt has graves disase and needs eye ointment to  keeps lubricated   Allergy    Anemia    currently taking iron   Asthma    no meds, years no problems   Atrial fibrillation (HCC)    secondary to thyroid    Depression    Fibromyalgia    Hyperthyroidism    Migraines    history   PONV (postoperative nausea and vomiting)    after all surgeries including colonoscopy   Thyroid  disease     Past Surgical History:  Procedure Laterality Date   ABDOMINAL HYSTERECTOMY     APPENDECTOMY     BILATERAL SALPINGECTOMY Bilateral 12/13/2014   Procedure: BILATERAL SALPINGECTOMY;  Surgeon: Meriam Stamp, MD;  Location: [facility];  Service: Gynecology;  Laterality: Bilateral;   BREAST ENHANCEMENT SURGERY     BREAST IMPLANT REMOVAL     BUNIONECTOMY     CESAREAN SECTION     CHOLECYSTECTOMY     COLONOSCOPY     DILATATION & CURRETTAGE/HYSTEROSCOPY WITH RESECTOCOPE N/A 03/31/2013   Procedure: DILATATION & CURETTAGE/HYSTEROSCOPY WITH RESECTOCOPE;  Surgeon: Dyanna Glasgow, DO;  Location: [facility];  Service: Gynecology;  Laterality: N/A;   ENDOMETRIAL ABLATION     EYE SURGERY     KNEE ARTHROSCOPY     ROBOTIC ASSISTED TOTAL HYSTERECTOMY N/A 12/13/2014   Procedure: ROBOTIC ASSISTED TOTAL HYSTERECTOMY;  Surgeon: Meriam Stamp, MD;  Location: [facility];  Service: Gynecology;  Laterality: N/A;  2 1/2 hrs.   TUBAL LIGATION  Social History   Tobacco Use   Smoking status: Never   Smokeless tobacco: Never  Substance Use Topics   Alcohol use: Yes    Alcohol/week: 1.0 - 2.0 standard drink of alcohol    Types: 1 - 2 Standard drinks or equivalent per week    Comment: HAD ETOH ON TUESDAY   Drug use: No    Family History  Problem Relation Age of Onset   Depression Mother    Dementia Mother    Cancer Father    Dementia Father    Depression Sister    Depression Brother    Pernicious anemia Paternal Aunt    Pernicious anemia Paternal Grandmother    Alcohol abuse Neg Hx    Bipolar disorder Son     Allergies  Allergen Reactions   Influenza Vaccines  Other (See Comments)    AFIB  Vaccine product containing only Influenza virus antigen (medicinal product)   Gluten Meal    Milk (Cow) Nausea Only    Cow milk only   Molds & Smuts Other (See Comments)    Sick to stomach - headache   Pollen Extract Other (See Comments)    sneezing   Wheat Other (See Comments)   Wound Dressing Adhesive Rash    Including Dottie HRT- Generic Patch    Medication list has been reviewed and updated.  Current Outpatient Medications on File Prior to Visit  Medication Sig Dispense Refill   ARMOUR THYROID  PO Take 0.25 mg by mouth daily.     ascorbic acid (VITAMIN C) 100 MG tablet Take 2,000 mg by mouth 2 (two) times daily.     Cholecalciferol (VITAMIN D3) 5000 UNITS CAPS Take 1 capsule by mouth daily.     diazepam  (VALIUM ) 2 MG tablet TAKE 1/2 TO 1 TABLET(1 TO 2 MG) BY MOUTH AT BEDTIME AS NEEDED 30 tablet 0   estradiol  (DOTTI ) 0.075 MG/24HR Place 1 patch onto the skin 2 (two) times a week. 24 patch 1   Levomefolate Glucosamine (METHYL-FOLATE) 1700 MCG CAPS Take by mouth.     MELATONIN PO Take 60 mg by mouth at bedtime.     Multiple Vitamins-Minerals (ZINC  PO) Take 1 tablet by mouth daily. With copper      NALTREXONE HCL PO Take 4.5 mg by mouth daily. For auto immune issues     Nattokinase 100 MG CAPS Take 100 mg by mouth daily.     Nutritional Supplements (DHEA PO) Take 100 mg by mouth daily.     Omega-3 Fatty Acids (FISH OIL) 1000 MG CAPS Take 1 capsule by mouth 3 (three) times daily.     Probiotic Product (PROBIOTIC PO) Take 1 capsule by mouth daily.     progesterone  (PROMETRIUM ) 100 MG capsule Take 2 capsules (200 mg total) by mouth at bedtime. 180 capsule 3   RESVERATROL PO Take 500 mg by mouth in the morning and at bedtime.     Testosterone  10 MG/ACT (2%) GEL Place 0.25-0.5 mLs onto the skin at bedtime. Apply behind the knee     thyroid  (ARMOUR) 90 MG tablet Take 90 mg by mouth daily.     triamcinolone cream (KENALOG) 0.1 % SMARTSIG:1 Application Topical  2-3 Times Daily     UNABLE TO FIND Med Name: MK 7 bone support     UNABLE TO FIND Take 10 mg by mouth 2 (two) times daily. Med Name: SPERMIDINE     ivermectin (STROMECTOL) 3 MG TABS tablet Take 30 mg by mouth every other day. (Patient  not taking: Reported on 12/18/2023)     liothyronine  (CYTOMEL ) 5 MCG tablet TAKE 1 TABLET(5 MCG) BY MOUTH DAILY (Patient not taking: Reported on 12/18/2023) 90 tablet 0   metFORMIN (GLUCOPHAGE) 500 MG tablet Take 500 mg by mouth 2 (two) times daily. (Patient not taking: Reported on 12/18/2023)     METHYLENE BLUE PO Take 10 drops by mouth in the morning and at bedtime. (Patient not taking: Reported on 12/18/2023)     No current facility-administered medications on file prior to visit.    Review of Systems:  As per HPI- otherwise negative.   Physical Examination: Vitals:   12/18/23 1250  BP: 120/64  Pulse: 65  Resp: 16  Temp: 98.8 F (37.1 C)  SpO2: 97%   Vitals:   12/18/23 1250  Weight: 137 lb 12.8 oz (62.5 kg)  Height: 5\' 7"  (1.702 m)   Body mass index is 21.58 kg/m. Ideal Body Weight: Weight in (lb) to have BMI = 25: 159.3  GEN: no acute distress. Slim, looks well  HEENT: Atraumatic, Normocephalic.  Oropharynx wnl Ears and Nose: No external deformity. CV: RRR, No M/G/R. No JVD. No thrill. No extra heart sounds. PULM: CTA B, no wheezes, crackles, rhonchi. No retractions. No resp. distress. No accessory muscle use. EXTR: No c/c/e PSYCH: Normally interactive. Conversant.  Likely eczema type rash on legs as below.  Does appear suspicious for contact dermatitis, but patient strongly denies any exposure to plants or other possible causes, and she does not have pets  Strong pulses in bilateral feet, feet are normal       Assessment and Plan: Other eczema - Plan: triamcinolone cream (KENALOG) 0.1 % Patient seen today with presumed eczema type rash on her bilateral shins  Use the triamcinolone twice a day for the rash on your legs If you are  not seeing good improvement in 7- 10 days let me know; sooner if no improvement or worse  If this clears up your rash after a couple of weeks set the cream aside, you can use in the future as needed Signed Gates Kasal, MD

## 2023-12-17 DIAGNOSIS — H524 Presbyopia: Secondary | ICD-10-CM | POA: Diagnosis not present

## 2023-12-18 ENCOUNTER — Ambulatory Visit (INDEPENDENT_AMBULATORY_CARE_PROVIDER_SITE_OTHER): Admitting: Family Medicine

## 2023-12-18 VITALS — BP 120/64 | HR 65 | Temp 98.8°F | Resp 16 | Ht 67.0 in | Wt 137.8 lb

## 2023-12-18 DIAGNOSIS — L308 Other specified dermatitis: Secondary | ICD-10-CM

## 2023-12-18 DIAGNOSIS — R531 Weakness: Secondary | ICD-10-CM | POA: Diagnosis not present

## 2023-12-18 DIAGNOSIS — M25512 Pain in left shoulder: Secondary | ICD-10-CM | POA: Diagnosis not present

## 2023-12-18 MED ORDER — TRIAMCINOLONE ACETONIDE 0.1 % EX CREA
1.0000 | TOPICAL_CREAM | Freq: Two times a day (BID) | CUTANEOUS | 1 refills | Status: DC
Start: 2023-12-18 — End: 2024-03-02

## 2023-12-18 NOTE — Patient Instructions (Addendum)
 It was good to see you today- I hope your skin is better son! Use the triamcinolone twice a day for the rash on your legs If you are not seeing good improvement in 7- 10 days let me know; sooner if no improvement or worse  If this clears up your rash after a couple of weeks set the cream aside, you can use in the future as needed

## 2023-12-19 DIAGNOSIS — K080 Exfoliation of teeth due to systemic causes: Secondary | ICD-10-CM | POA: Diagnosis not present

## 2023-12-23 DIAGNOSIS — R531 Weakness: Secondary | ICD-10-CM | POA: Diagnosis not present

## 2023-12-23 DIAGNOSIS — M25512 Pain in left shoulder: Secondary | ICD-10-CM | POA: Diagnosis not present

## 2023-12-30 ENCOUNTER — Other Ambulatory Visit: Payer: Self-pay | Admitting: Family Medicine

## 2023-12-30 DIAGNOSIS — M25512 Pain in left shoulder: Secondary | ICD-10-CM | POA: Diagnosis not present

## 2023-12-30 DIAGNOSIS — R531 Weakness: Secondary | ICD-10-CM | POA: Diagnosis not present

## 2023-12-30 DIAGNOSIS — F5102 Adjustment insomnia: Secondary | ICD-10-CM

## 2023-12-31 DIAGNOSIS — K080 Exfoliation of teeth due to systemic causes: Secondary | ICD-10-CM | POA: Diagnosis not present

## 2024-01-01 DIAGNOSIS — R531 Weakness: Secondary | ICD-10-CM | POA: Diagnosis not present

## 2024-01-01 DIAGNOSIS — M25512 Pain in left shoulder: Secondary | ICD-10-CM | POA: Diagnosis not present

## 2024-01-15 DIAGNOSIS — M79671 Pain in right foot: Secondary | ICD-10-CM | POA: Diagnosis not present

## 2024-01-29 DIAGNOSIS — K080 Exfoliation of teeth due to systemic causes: Secondary | ICD-10-CM | POA: Diagnosis not present

## 2024-02-17 ENCOUNTER — Other Ambulatory Visit: Payer: Self-pay | Admitting: Family Medicine

## 2024-02-17 MED ORDER — ESTRADIOL 0.075 MG/24HR TD PTTW: 1.0000 | MEDICATED_PATCH | TRANSDERMAL | 1 refills | Status: AC

## 2024-02-17 NOTE — Telephone Encounter (Signed)
 Copied from CRM [id number]. Topic: Clinical - Medication Refill >> Feb 17, 2024 11:27 AM Laymon HERO wrote: Medication: estradiol  (DOTTI ) 0.075 MG/24HR  Has the patient contacted their pharmacy? Yes (Agent: If no, request that the patient contact the pharmacy for the refill. If patient does not wish to contact the pharmacy document the reason why and proceed with request.) (Agent: If yes, when and what did the pharmacy advise?)  This is the patient's preferred pharmacy:  [id number] - [place], [place] - [address] [place] [address] Phone: [phone] Fax: [phone]  Is this the correct pharmacy for this prescription? Yes If no, delete pharmacy and type the correct one.   Has the prescription been filled recently? Yes  Is the patient out of the medication? Yes  Has the patient been seen for an appointment in the last year OR does the patient have an upcoming appointment? Yes  Can we respond through MyChart? Yes  Agent: Please be advised that Rx refills may take up to 3 business days. We ask that you follow-up with your pharmacy.

## 2024-03-02 ENCOUNTER — Ambulatory Visit (INDEPENDENT_AMBULATORY_CARE_PROVIDER_SITE_OTHER)

## 2024-03-02 VITALS — Ht 67.0 in | Wt 137.0 lb

## 2024-03-02 DIAGNOSIS — M1712 Unilateral primary osteoarthritis, left knee: Secondary | ICD-10-CM | POA: Diagnosis not present

## 2024-03-02 DIAGNOSIS — Z0000 Encounter for general adult medical examination without abnormal findings: Secondary | ICD-10-CM

## 2024-03-02 NOTE — Progress Notes (Signed)
 Subjective:   Christy Hill is a 68 y.o. who presents for a Medicare Wellness preventive visit.  As a reminder, Annual Wellness Visits don't include a physical exam, and some assessments may be limited, especially if this visit is performed virtually. We may recommend an in-person follow-up visit with your provider if needed.  Visit Complete: Virtual I connected with  [name] on 03/02/24 by a audio enabled telemedicine application and verified that I am speaking with the correct person using two identifiers.  Patient Location: Home  Provider Location: Home Office  I discussed the limitations of evaluation and management by telemedicine. The patient expressed understanding and agreed to proceed.  Vital Signs: Because this visit was a virtual/telehealth visit, some criteria may be missing or patient reported. Any vitals not documented were not able to be obtained and vitals that have been documented are patient reported.  VideoDeclined- This patient declined Librarian, academic. Therefore the visit was completed with audio only.  Persons Participating in Visit: Patient.  AWV Questionnaire: Yes: Patient Medicare AWV questionnaire was completed by the patient on 03/01/24; I have confirmed that all information answered by patient is correct and no changes since this date.  Cardiac Risk Factors include: advanced age (>83men, >72 women)     Objective:    Today's Vitals   03/02/24 1107  Weight: 137 lb (62.1 kg)  Height: 5' 7 (1.702 m)   Body mass index is 21.46 kg/m.     03/02/2024   11:16 AM 02/25/2023    3:48 PM 12/13/2014    6:00 PM 12/08/2014   10:06 AM 03/29/2013    2:05 PM  Advanced Directives  Does Patient Have a Medical Advance Directive? No Yes No  No  Patient has advance directive, copy not in chart   Type of Advance Directive  Healthcare Power of [place];Living will   Living will   Does patient want to make changes to medical advance  directive?  No - Patient declined     Copy of Healthcare Power of Attorney in Chart?  No - copy requested     Would patient like information on creating a medical advance directive? Yes (MAU/Ambulatory/Procedural Areas - Information given)  No - patient declined information  No - patient declined information    Pre-existing out of facility DNR order (yellow form or pink MOST form)     No      Data saved with a previous flowsheet row definition    Current Medications (verified) Outpatient Encounter Medications as of 03/02/2024  Medication Sig   ascorbic acid (VITAMIN C) 100 MG tablet Take 2,000 mg by mouth 2 (two) times daily.   Cholecalciferol (VITAMIN D3) 5000 UNITS CAPS Take 1 capsule by mouth daily.   diazepam  (VALIUM ) 2 MG tablet TAKE 1/2 TO 1 TABLET(1 TO 2 MG) BY MOUTH AT BEDTIME AS NEEDED   estradiol  (DOTTI ) 0.075 MG/24HR Place 1 patch onto the skin 2 (two) times a week.   Levomefolate Glucosamine (METHYL-FOLATE) 1700 MCG CAPS Take by mouth.   MELATONIN PO Take 60 mg by mouth at bedtime.   Multiple Vitamins-Minerals (ZINC  PO) Take 1 tablet by mouth daily. With copper    NALTREXONE HCL PO Take 4.5 mg by mouth daily. For auto immune issues   Nutritional Supplements (DHEA PO) Take 100 mg by mouth daily.   Omega-3 Fatty Acids (FISH OIL) 1000 MG CAPS Take 1 capsule by mouth 3 (three) times daily.   Probiotic Product (PROBIOTIC PO) Take  1 capsule by mouth daily.   progesterone  (PROMETRIUM ) 100 MG capsule Take 2 capsules (200 mg total) by mouth at bedtime.   Testosterone  10 MG/ACT (2%) GEL Place 0.25-0.5 mLs onto the skin at bedtime. Apply behind the knee   UNABLE TO FIND Med Name: MK 7 bone support   metFORMIN (GLUCOPHAGE) 500 MG tablet Take 500 mg by mouth 2 (two) times daily. (Patient not taking: Reported on 12/18/2023)   Nattokinase 100 MG CAPS Take 100 mg by mouth daily.   [DISCONTINUED] ARMOUR THYROID  PO Take 0.25 mg by mouth daily.   [DISCONTINUED] ivermectin (STROMECTOL) 3 MG TABS  tablet Take 30 mg by mouth every other day. (Patient not taking: Reported on 12/18/2023)   [DISCONTINUED] liothyronine  (CYTOMEL ) 5 MCG tablet TAKE 1 TABLET(5 MCG) BY MOUTH DAILY (Patient not taking: Reported on 12/18/2023)   [DISCONTINUED] METHYLENE BLUE PO Take 10 drops by mouth in the morning and at bedtime. (Patient not taking: Reported on 12/18/2023)   [DISCONTINUED] RESVERATROL PO Take 500 mg by mouth in the morning and at bedtime.   [DISCONTINUED] thyroid  (ARMOUR) 90 MG tablet Take 90 mg by mouth daily.   [DISCONTINUED] triamcinolone  cream (KENALOG ) 0.1 % Apply 1 Application topically 2 (two) times daily. Use on legs as needed for rash   [DISCONTINUED] UNABLE TO FIND Take 10 mg by mouth 2 (two) times daily. Med Name: SPERMIDINE   No facility-administered encounter medications on file as of 03/02/2024.    Allergies (verified) Influenza vaccines, Gluten meal, Milk (cow), Molds & smuts, Pollen extract, Wheat, and Wound dressing adhesive   History: Past Medical History:  Diagnosis Date   Adverse effect of general anesthetic    pt has graves disase and needs eye ointment to keeps lubricated   Allergy    Anemia    currently taking iron   Asthma    no meds, years no problems   Atrial fibrillation (HCC)    secondary to thyroid    Cataract    Depression    Fibromyalgia    Hyperthyroidism    Migraines    history   PONV (postoperative nausea and vomiting)    after all surgeries including colonoscopy   Thyroid  disease    Past Surgical History:  Procedure Laterality Date   ABDOMINAL HYSTERECTOMY     APPENDECTOMY     BILATERAL SALPINGECTOMY Bilateral 12/13/2014   Procedure: BILATERAL SALPINGECTOMY;  Surgeon: Charlie Flowers, MD;  Location: [facility];  Service: Gynecology;  Laterality: Bilateral;   BREAST ENHANCEMENT SURGERY     BREAST IMPLANT REMOVAL     BREAST SURGERY     BUNIONECTOMY     CESAREAN SECTION     CHOLECYSTECTOMY     COLONOSCOPY     DILATATION & CURRETTAGE/HYSTEROSCOPY  WITH RESECTOCOPE N/A 03/31/2013   Procedure: DILATATION & CURETTAGE/HYSTEROSCOPY WITH RESECTOCOPE;  Surgeon: Duwaine Blumenthal, DO;  Location: [facility];  Service: Gynecology;  Laterality: N/A;   ENDOMETRIAL ABLATION     EYE SURGERY     KNEE ARTHROSCOPY     ROBOTIC ASSISTED TOTAL HYSTERECTOMY N/A 12/13/2014   Procedure: ROBOTIC ASSISTED TOTAL HYSTERECTOMY;  Surgeon: Charlie Flowers, MD;  Location: [facility];  Service: Gynecology;  Laterality: N/A;  2 1/2 hrs.   TUBAL LIGATION     Family History  Problem Relation Age of Onset   Depression Mother    Dementia Mother    Cancer Father    Dementia Father    Depression Sister    Depression Brother    Pernicious anemia Paternal  Aunt    Pernicious anemia Paternal Grandmother    Bipolar disorder Son    Alcohol abuse Neg Hx    Social History   Socioeconomic History   Marital status: Married    Spouse name: Not on file   Number of children: Not on file   Years of education: Not on file   Highest education level: Master's degree (e.g., MA, MS, MEng, MEd, MSW, MBA)  Occupational History   Occupation: Enrolled Agent  Tobacco Use   Smoking status: Never   Smokeless tobacco: Never  Substance and Sexual Activity   Alcohol use: Yes    Alcohol/week: 1.0 - 2.0 standard drink of alcohol    Types: 1 - 2 Standard drinks or equivalent per week    Comment: HAD ETOH ON TUESDAY   Drug use: No   Sexual activity: Yes  Other Topics Concern   Not on file  Social History Narrative   Not on file   Social Drivers of Health   Financial Resource Strain: Low Risk  (03/01/2024)   Overall Financial Resource Strain (CARDIA)    Difficulty of Paying Living Expenses: Not hard at all  Food Insecurity: No Food Insecurity (03/01/2024)   Hunger Vital Sign    Worried About Running Out of Food in the Last Year: Never true    Ran Out of Food in the Last Year: Never true  Transportation Needs: No Transportation Needs (03/01/2024)   PRAPARE - Scientist, research (physical sciences) (Medical): No    Lack of Transportation (Non-Medical): No  Physical Activity: Sufficiently Active (03/01/2024)   Exercise Vital Sign    Days of Exercise per Week: 6 days    Minutes of Exercise per Session: 60 min  Stress: No Stress Concern Present (03/01/2024)   [employer] of Occupational Health - Occupational Stress Questionnaire    Feeling of Stress: Only a little  Social Connections: Socially Integrated (03/01/2024)   Social Connection and Isolation Panel    Frequency of Communication with Friends and Family: More than three times a week    Frequency of Social Gatherings with Friends and Family: More than three times a week    Attends Religious Services: More than 4 times per year    Active Member of [employer] or Organizations: Yes    Attends Engineer, structural: More than 4 times per year    Marital Status: Married    Tobacco Counseling Counseling given: Not Answered    Clinical Intake:  Pre-visit preparation completed: Yes  Pain : No/denies pain  Diabetes: No  Lab Results  Component Value Date   HGBA1C 5.4 03/19/2023   HGBA1C 5.4 12/21/2020   HGBA1C 5.3 08/31/2018     How often do you need to have someone help you when you read instructions, pamphlets, or other written materials from your doctor or pharmacy?: 1 - Never  Interpreter Needed?: No  Information entered by :: Charmaine Bloodgood LPN   Activities of Daily Living     03/01/2024   12:40 PM  In your present state of health, do you have any difficulty performing the following activities:  Hearing? 0  Vision? 0  Difficulty concentrating or making decisions? 0  Walking or climbing stairs? 0  Dressing or bathing? 0  Doing errands, shopping? 0  Preparing Food and eating ? N  Using the Toilet? N  In the past six months, have you accidently leaked urine? N  Do you have problems with loss of bowel control? N  Managing your Medications? N  Managing your Finances? N  Housekeeping or  managing your Housekeeping? N    Patient Care Team: Copland, Harlene BROCKS, MD as PCP - General (Family Medicine) Jarold Mayo, MD as Consulting Physician (Ophthalmology) Kit Rush, MD as Consulting Physician (Orthopedic Surgery)  I have updated your Care Teams any recent Medical Services you may have received from other providers in the past year.     Assessment:   This is a routine wellness examination for Shamia.  Hearing/Vision screen Hearing Screening - Comments:: Denies hearing difficulties   Vision Screening - Comments::  up to date with routine eye exams with Dr. Cornelious    Goals Addressed             This Visit's Progress    Maintain health and independence   On track      Depression Screen     03/02/2024   11:14 AM 03/19/2023    1:37 PM 02/25/2023    3:47 PM 12/21/2020    8:18 AM 12/20/2015    9:37 AM 09/14/2013    8:25 AM  PHQ 2/9 Scores  PHQ - 2 Score 2 2 0 1 0 0  PHQ- 9 Score 2 2        Fall Risk     03/01/2024   12:40 PM 03/19/2023    1:34 PM 02/24/2023    8:56 PM 12/21/2020    8:19 AM 12/20/2015    9:37 AM  Fall Risk   Falls in the past year? 0 0 0 0 No   Number falls in past yr: 0 0 0    Injury with Fall? 0 0 0    Risk for fall due to : No Fall Risks History of fall(s);No Fall Risks No Fall Risks    Follow up Falls prevention discussed;Education provided;Falls evaluation completed Falls evaluation completed Falls evaluation completed       Data saved with a previous flowsheet row definition    MEDICARE RISK AT HOME:  Medicare Risk at Home Any stairs in or around the home?: (Patient-Rptd) Yes If so, are there any without handrails?: (Patient-Rptd) No Home free of loose throw rugs in walkways, pet beds, electrical cords, etc?: (Patient-Rptd) Yes Adequate lighting in your home to reduce risk of falls?: (Patient-Rptd) Yes Life alert?: (Patient-Rptd) No Use of a cane, walker or w/c?: (Patient-Rptd) No Grab bars in the bathroom?: (Patient-Rptd)  No Shower chair or bench in shower?: (Patient-Rptd) No Elevated toilet seat or a handicapped toilet?: (Patient-Rptd) No  TIMED UP AND GO:  Was the test performed?  No  Cognitive Function: Declined/Normal: No cognitive concerns noted by patient or family. Patient alert, oriented, able to answer questions appropriately and recall recent events. No signs of memory loss or confusion.        02/25/2023    3:49 PM  6CIT Screen  What Year? 0 points  What month? 0 points  What time? 0 points  Count back from 20 0 points  Months in reverse 0 points  Repeat phrase 0 points  Total Score 0 points    Immunizations Immunization History  Administered Date(s) Administered   Influenza,inj,Quad PF,6+ Mos 05/26/2014   Td 01/29/1994    Screening Tests Health Maintenance  Topic Date Due   COVID-19 Vaccine (1) Never done   Zoster Vaccines- Shingrix (1 of 2) Never done   DTaP/Tdap/Td (2 - Tdap) 01/30/2004   Pneumococcal Vaccine: 50+ Years (1 of 1 - PCV) Never done   MAMMOGRAM  12/19/2023   Medicare Annual Wellness (AWV)  03/02/2025   Colonoscopy  06/16/2033   DEXA SCAN  Completed   Hepatitis C Screening  Completed   Hepatitis B Vaccines  Aged Out   HPV VACCINES  Aged Out   Meningococcal B Vaccine  Aged Out   Fecal DNA (Cologuard)  Discontinued    Health Maintenance  Health Maintenance Due  Topic Date Due   COVID-19 Vaccine (1) Never done   Zoster Vaccines- Shingrix (1 of 2) Never done   DTaP/Tdap/Td (2 - Tdap) 01/30/2004   Pneumococcal Vaccine: 50+ Years (1 of 1 - PCV) Never done   MAMMOGRAM  12/19/2023   Health Maintenance Items Addressed: Will discuss mammogram further with pcp  Additional Screening:  Vision Screening: Recommended annual ophthalmology exams for early detection of glaucoma and other disorders of the eye. Would you like a referral to an eye doctor? No    Dental Screening: Recommended annual dental exams for proper oral hygiene  Community Resource Referral  / Chronic Care Management: CRR required this visit?  No   CCM required this visit?  No   Plan:    I have personally reviewed and noted the following in the patient's chart:   Medical and social history Use of alcohol, tobacco or illicit drugs  Current medications and supplements including opioid prescriptions. Patient is not currently taking opioid prescriptions. Functional ability and status Nutritional status Physical activity Advanced directives List of other physicians Hospitalizations, surgeries, and ER visits in previous 12 months Vitals Screenings to include cognitive, depression, and falls Referrals and appointments  In addition, I have reviewed and discussed with patient certain preventive protocols, quality metrics, and best practice recommendations. A written personalized care plan for preventive services as well as general preventive health recommendations were provided to patient.   Lavelle Pfeiffer [place], [place]   [id number]/7974   After Visit Summary: (MyChart) Due to this being a telephonic visit, the after visit summary with patients personalized plan was offered to patient via MyChart   Notes: PCP Follow Up Recommendations: Patient would like to discuss breast cancer screening and if moving forward she is able to have ultrasounds instead of mammograms.

## 2024-03-02 NOTE — Patient Instructions (Signed)
 Ms. Christy Hill , Thank you for taking time out of your busy schedule to complete your Annual Wellness Visit with me. I enjoyed our conversation and look forward to speaking with you again next year. I, as well as your care team,  appreciate your ongoing commitment to your health goals. Please review the following plan we discussed and let me know if I can assist you in the future. Your Game plan/ To Do List     Follow up Visits: Next Medicare AWV with our clinical staff: In 1 year    Have you seen your provider in the last 6 months (3 months if uncontrolled diabetes)? Yes Next Office Visit with your provider: 03/22/24 @ 1:20  Clinician Recommendations:  Aim for 30 minutes of exercise or brisk walking, 6-8 glasses of water, and 5 servings of fruits and vegetables each day.       This is a list of the screening recommended for you and due dates:  Health Maintenance  Topic Date Due   COVID-19 Vaccine (1) Never done   Zoster (Shingles) Vaccine (1 of 2) Never done   DTaP/Tdap/Td vaccine (2 - Tdap) 01/30/2004   Pneumococcal Vaccine for age over 16 (1 of 1 - PCV) Never done   Mammogram  12/19/2023   Medicare Annual Wellness Visit  03/02/2025   Colon Cancer Screening  06/16/2033   DEXA scan (bone density measurement)  Completed   Hepatitis C Screening  Completed   Hepatitis B Vaccine  Aged Out   HPV Vaccine  Aged Out   Meningitis B Vaccine  Aged Out   Cologuard (Stool DNA test)  Discontinued    Advanced directives: (ACP Link)Information on Advanced Care Planning can be found at [place]  Secretary of [facility] Advance Health Care Directives Advance Health Care Directives. [email or web address]   Advance Care Planning is important because it:  [x]  Makes sure you receive the medical care that is consistent with your values, goals, and preferences  [x]  It provides guidance to your family and loved ones and reduces their decisional burden about whether or not they are making the right decisions based on your  wishes.  Follow the link provided in your after visit summary or read over the paperwork we have mailed to you to help you started getting your Advance Directives in place. If you need assistance in completing these, please reach out to us  so that we can help you!  See attachments for Preventive Care and Fall Prevention Tips.

## 2024-03-02 NOTE — Addendum Note (Signed)
 Addended by: LAVELLE PFEIFFER B on: 03/02/2024 03:20 PM   Modules accepted: Level of Service

## 2024-03-04 DIAGNOSIS — K080 Exfoliation of teeth due to systemic causes: Secondary | ICD-10-CM | POA: Diagnosis not present

## 2024-03-16 DIAGNOSIS — L821 Other seborrheic keratosis: Secondary | ICD-10-CM | POA: Diagnosis not present

## 2024-03-16 DIAGNOSIS — L570 Actinic keratosis: Secondary | ICD-10-CM | POA: Diagnosis not present

## 2024-03-16 DIAGNOSIS — D225 Melanocytic nevi of trunk: Secondary | ICD-10-CM | POA: Diagnosis not present

## 2024-03-16 DIAGNOSIS — Z129 Encounter for screening for malignant neoplasm, site unspecified: Secondary | ICD-10-CM | POA: Diagnosis not present

## 2024-03-19 NOTE — Progress Notes (Unsigned)
 [facility] [address] [place], [place] [address] [phone]  Date:  03/22/2024   Name:  Christy Hill   DOB:  1956/01/21   MRN:  [id number]  PCP:  Watt Harlene BROCKS, MD    Chief Complaint: No chief complaint on file.   History of Present Illness:  Christy Hill is a 68 y.o. very pleasant female patient who presents with the following:  Patient seen today for follow-up/physical exam.  Most recent visit with me was in May to discuss eczema  History of atrial fib, hyperthyroidism/ Graves disease s/p thyroidectomy.  No longer taking flecainide .  Status post hysterectomy for benign disease [facility] is managing her thyroid  She is on HRT with estradiol  patch and progesterone  by mouth daily - Need to discuss tapering off of hormones given her age  Hormone replacement therapy (HRT) in post-menopausal women is generally recommended to be discontinued by the age of 40 or after 10 years of use due to increased risks of stroke, venous thromboembolism (VTE), coronary artery disease (CAD), and breast cancer.  The risk of breast cancer becomes significant after 5 years of combination HRT use and increases with duration.[1]  For women with a hysterectomy using estrogen-only HRT, the risk of stroke and VTE should guide discontinuation discussions after 10 years or at age 48.[1]  The benefit-risk ratio of HRT becomes less favorable in women aged 22 or older or more than 10 years post-menopause.[2-5]  Decisions to continue HRT beyond age 51 should be individualized based on quality of life, persistent symptoms, and alternative treatments.[  Mammogram can be updated She has declined routine immunizations Colon cancer screening up-to-date DEXA scan can be updated this month Patient Active Problem List   Diagnosis Date Noted   Osteopenia 03/14/2022   Dysmenorrhea 12/13/2014   Graves' ophthalmopathy 09/14/2013    Synovitis of finger 09/14/2013   Thyroid  eye disease 04/05/2013   Vaginal spotting 12/10/2012   Hyperthyroidism 06/22/2012   History of fibromyalgia 06/21/2012   Allergic rhinitis 06/21/2012   Menopause 06/11/2012   History of migraine headaches 06/11/2012   Depression 06/11/2012   S/P cholecystectomy 06/11/2012   S/P exploratory laparotomy 06/11/2012   New onset atrial fibrillation (HCC) 06/11/2012    Past Medical History:  Diagnosis Date   Adverse effect of general anesthetic    pt has graves disase and needs eye ointment to keeps lubricated   Allergy    Anemia    currently taking iron   Asthma    no meds, years no problems   Atrial fibrillation (HCC)    secondary to thyroid    Cataract    Depression    Fibromyalgia    Hyperthyroidism    Migraines    history   PONV (postoperative nausea and vomiting)    after all surgeries including colonoscopy   Thyroid  disease     Past Surgical History:  Procedure Laterality Date   ABDOMINAL HYSTERECTOMY     APPENDECTOMY     BILATERAL SALPINGECTOMY Bilateral 12/13/2014   Procedure: BILATERAL SALPINGECTOMY;  Surgeon: Charlie Flowers, MD;  Location: [facility];  Service: Gynecology;  Laterality: Bilateral;   BREAST ENHANCEMENT SURGERY     BREAST IMPLANT REMOVAL     BREAST SURGERY     BUNIONECTOMY     CESAREAN SECTION     CHOLECYSTECTOMY     COLONOSCOPY     DILATATION & CURRETTAGE/HYSTEROSCOPY WITH RESECTOCOPE N/A 03/31/2013   Procedure: DILATATION &  CURETTAGE/HYSTEROSCOPY WITH RESECTOCOPE;  Surgeon: Duwaine Blumenthal, DO;  Location: [facility];  Service: Gynecology;  Laterality: N/A;   ENDOMETRIAL ABLATION     EYE SURGERY     KNEE ARTHROSCOPY     ROBOTIC ASSISTED TOTAL HYSTERECTOMY N/A 12/13/2014   Procedure: ROBOTIC ASSISTED TOTAL HYSTERECTOMY;  Surgeon: Charlie Flowers, MD;  Location: [facility];  Service: Gynecology;  Laterality: N/A;  2 1/2 hrs.   TUBAL LIGATION      Social History   Tobacco Use   Smoking status: Never   Smokeless  tobacco: Never  Substance Use Topics   Alcohol use: Yes    Alcohol/week: 1.0 - 2.0 standard drink of alcohol    Types: 1 - 2 Standard drinks or equivalent per week    Comment: HAD ETOH ON TUESDAY   Drug use: No    Family History  Problem Relation Age of Onset   Depression Mother    Dementia Mother    Cancer Father    Dementia Father    Depression Sister    Depression Brother    Pernicious anemia Paternal Aunt    Pernicious anemia Paternal Grandmother    Bipolar disorder Son    Alcohol abuse Neg Hx     Allergies  Allergen Reactions   Influenza Vaccines Other (See Comments)    AFIB  Vaccine product containing only Influenza virus antigen (medicinal product)   Gluten Meal    Milk (Cow) Nausea Only    Cow milk only   Molds & Smuts Other (See Comments)    Sick to stomach - headache   Pollen Extract Other (See Comments)    sneezing   Wheat Other (See Comments)   Wound Dressing Adhesive Rash    Including Dottie HRT- Generic Patch    Medication list has been reviewed and updated.  Current Outpatient Medications on File Prior to Visit  Medication Sig Dispense Refill   ascorbic acid (VITAMIN C) 100 MG tablet Take 2,000 mg by mouth 2 (two) times daily.     Cholecalciferol (VITAMIN D3) 5000 UNITS CAPS Take 1 capsule by mouth daily.     diazepam  (VALIUM ) 2 MG tablet TAKE 1/2 TO 1 TABLET(1 TO 2 MG) BY MOUTH AT BEDTIME AS NEEDED 30 tablet 0   estradiol  (DOTTI ) 0.075 MG/24HR Place 1 patch onto the skin 2 (two) times a week. 24 patch 1   Levomefolate Glucosamine (METHYL-FOLATE) 1700 MCG CAPS Take by mouth.     MELATONIN PO Take 60 mg by mouth at bedtime.     metFORMIN (GLUCOPHAGE) 500 MG tablet Take 500 mg by mouth 2 (two) times daily. (Patient not taking: Reported on 12/18/2023)     Multiple Vitamins-Minerals (ZINC  PO) Take 1 tablet by mouth daily. With copper      NALTREXONE HCL PO Take 4.5 mg by mouth daily. For auto immune issues     Nattokinase 100 MG CAPS Take 100 mg by mouth  daily.     Nutritional Supplements (DHEA PO) Take 100 mg by mouth daily.     Omega-3 Fatty Acids (FISH OIL) 1000 MG CAPS Take 1 capsule by mouth 3 (three) times daily.     Probiotic Product (PROBIOTIC PO) Take 1 capsule by mouth daily.     progesterone  (PROMETRIUM ) 100 MG capsule Take 2 capsules (200 mg total) by mouth at bedtime. 180 capsule 3   Testosterone  10 MG/ACT (2%) GEL Place 0.25-0.5 mLs onto the skin at bedtime. Apply behind the knee     UNABLE TO FIND Med  Name: [facility] 7 bone support     No current facility-administered medications on file prior to visit.    Review of Systems:  As per HPI- otherwise negative.   Physical Examination: There were no vitals filed for this visit. There were no vitals filed for this visit. There is no height or weight on file to calculate BMI. Ideal Body Weight:    GEN: no acute distress. HEENT: Atraumatic, Normocephalic.  Ears and Nose: No external deformity. CV: RRR, No M/G/R. No JVD. No thrill. No extra heart sounds. PULM: CTA B, no wheezes, crackles, rhonchi. No retractions. No resp. distress. No accessory muscle use. ABD: S, NT, ND, +BS. No rebound. No HSM. EXTR: No c/c/e PSYCH: Normally interactive. Conversant.    Assessment and Plan: ***  Signed Harlene Schroeder, MD

## 2024-03-22 ENCOUNTER — Ambulatory Visit (INDEPENDENT_AMBULATORY_CARE_PROVIDER_SITE_OTHER): Admitting: Family Medicine

## 2024-03-22 ENCOUNTER — Encounter: Payer: Self-pay | Admitting: Family Medicine

## 2024-03-22 ENCOUNTER — Other Ambulatory Visit: Payer: Self-pay | Admitting: Family Medicine

## 2024-03-22 VITALS — BP 108/62 | HR 67 | Temp 98.3°F | Ht 67.0 in | Wt 135.4 lb

## 2024-03-22 DIAGNOSIS — R5383 Other fatigue: Secondary | ICD-10-CM | POA: Diagnosis not present

## 2024-03-22 DIAGNOSIS — Z13220 Encounter for screening for lipoid disorders: Secondary | ICD-10-CM | POA: Diagnosis not present

## 2024-03-22 DIAGNOSIS — N6320 Unspecified lump in the left breast, unspecified quadrant: Secondary | ICD-10-CM

## 2024-03-22 DIAGNOSIS — Z79890 Hormone replacement therapy (postmenopausal): Secondary | ICD-10-CM

## 2024-03-22 DIAGNOSIS — N6310 Unspecified lump in the right breast, unspecified quadrant: Secondary | ICD-10-CM

## 2024-03-22 DIAGNOSIS — Z130 Encounter for screening for diseases of the blood and blood-forming organs and certain disorders involving the immune mechanism: Secondary | ICD-10-CM

## 2024-03-22 DIAGNOSIS — Z1231 Encounter for screening mammogram for malignant neoplasm of breast: Secondary | ICD-10-CM

## 2024-03-22 DIAGNOSIS — E039 Hypothyroidism, unspecified: Secondary | ICD-10-CM | POA: Diagnosis not present

## 2024-03-22 DIAGNOSIS — Z131 Encounter for screening for diabetes mellitus: Secondary | ICD-10-CM | POA: Diagnosis not present

## 2024-03-22 DIAGNOSIS — M8580 Other specified disorders of bone density and structure, unspecified site: Secondary | ICD-10-CM

## 2024-03-22 DIAGNOSIS — Z09 Encounter for follow-up examination after completed treatment for conditions other than malignant neoplasm: Secondary | ICD-10-CM

## 2024-03-22 DIAGNOSIS — Z0000 Encounter for general adult medical examination without abnormal findings: Secondary | ICD-10-CM

## 2024-03-22 DIAGNOSIS — R944 Abnormal results of kidney function studies: Secondary | ICD-10-CM

## 2024-03-22 NOTE — Patient Instructions (Addendum)
 Good to see you today- I will be in touch with your labs asap  Please set up your dexa scan (bone density) with our imaging dept at [facility] I ordered diagnostic mammo and bilateral ultrasound at the breast center   I do recommend routine immunizations for age including shingles, pneumonia, tetanus, flu

## 2024-03-23 ENCOUNTER — Encounter: Payer: Self-pay | Admitting: Family Medicine

## 2024-03-23 LAB — CBC
HCT: 43.9 % (ref 36.0–46.0)
Hemoglobin: 14.7 g/dL (ref 12.0–15.0)
MCHC: 33.6 g/dL (ref 30.0–36.0)
MCV: 95.2 fl (ref 78.0–100.0)
Platelets: 251 K/uL (ref 150.0–400.0)
RBC: 4.61 Mil/uL (ref 3.87–5.11)
RDW: 13.4 % (ref 11.5–15.5)
WBC: 10.3 K/uL (ref 4.0–10.5)

## 2024-03-23 LAB — VITAMIN B12: Vitamin B-12: 317 pg/mL (ref 211–911)

## 2024-03-23 LAB — LIPID PANEL
Cholesterol: 159 mg/dL (ref 0–200)
HDL: 64.7 mg/dL (ref >39.00)
LDL Cholesterol: 70 mg/dL (ref 0–99)
NonHDL: 93.99
Total CHOL/HDL Ratio: 2
Triglycerides: 118 mg/dL (ref 0.0–149.0)
VLDL: 23.6 mg/dL (ref 0.0–40.0)

## 2024-03-23 LAB — COMPREHENSIVE METABOLIC PANEL WITH GFR
ALT: 16 U/L (ref 0–35)
AST: 18 U/L (ref 0–37)
Albumin: 4.6 g/dL (ref 3.5–5.2)
Alkaline Phosphatase: 45 U/L (ref 39–117)
BUN: 23 mg/dL (ref 6–23)
CO2: 30 meq/L (ref 19–32)
Calcium: 9 mg/dL (ref 8.4–10.5)
Chloride: 102 meq/L (ref 96–112)
Creatinine, Ser: 1.08 mg/dL (ref 0.40–1.20)
GFR: 53.31 mL/min — ABNORMAL LOW (ref >60.00)
Glucose, Bld: 99 mg/dL (ref 70–99)
Potassium: 4.3 meq/L (ref 3.5–5.1)
Sodium: 141 meq/L (ref 135–145)
Total Bilirubin: 0.5 mg/dL (ref 0.2–1.2)
Total Protein: 6.5 g/dL (ref 6.0–8.3)

## 2024-03-23 LAB — T3, FREE: T3, Free: 3.7 pg/mL (ref 2.3–4.2)

## 2024-03-23 LAB — FERRITIN: Ferritin: 21.1 ng/mL (ref 10.0–291.0)

## 2024-03-23 LAB — HEMOGLOBIN A1C: Hgb A1c MFr Bld: 5.7 % (ref 4.6–6.5)

## 2024-03-23 NOTE — Addendum Note (Signed)
 Addended by: WATT RAISIN C on: 03/23/2024 12:00 PM   Modules accepted: Orders

## 2024-04-02 DIAGNOSIS — Z1330 Encounter for screening examination for mental health and behavioral disorders, unspecified: Secondary | ICD-10-CM | POA: Diagnosis not present

## 2024-04-02 DIAGNOSIS — I480 Paroxysmal atrial fibrillation: Secondary | ICD-10-CM | POA: Diagnosis not present

## 2024-04-29 ENCOUNTER — Ambulatory Visit (HOSPITAL_BASED_OUTPATIENT_CLINIC_OR_DEPARTMENT_OTHER)
Admission: RE | Admit: 2024-04-29 | Discharge: 2024-04-29 | Disposition: A | Discharge status: Home / Self Care | Source: Ambulatory Visit | Attending: Family Medicine | Admitting: Family Medicine

## 2024-04-29 ENCOUNTER — Encounter: Payer: Self-pay | Admitting: Family Medicine

## 2024-04-29 DIAGNOSIS — M8589 Other specified disorders of bone density and structure, multiple sites: Secondary | ICD-10-CM | POA: Diagnosis not present

## 2024-04-29 DIAGNOSIS — Z13820 Encounter for screening for osteoporosis: Secondary | ICD-10-CM | POA: Insufficient documentation

## 2024-04-29 DIAGNOSIS — M8580 Other specified disorders of bone density and structure, unspecified site: Secondary | ICD-10-CM | POA: Diagnosis not present

## 2024-05-13 DIAGNOSIS — M25562 Pain in left knee: Secondary | ICD-10-CM | POA: Diagnosis not present

## 2024-06-02 DIAGNOSIS — H3562 Retinal hemorrhage, left eye: Secondary | ICD-10-CM | POA: Diagnosis not present

## 2024-06-02 DIAGNOSIS — H43811 Vitreous degeneration, right eye: Secondary | ICD-10-CM | POA: Diagnosis not present

## 2024-06-02 DIAGNOSIS — H43391 Other vitreous opacities, right eye: Secondary | ICD-10-CM | POA: Diagnosis not present

## 2024-06-02 DIAGNOSIS — D3132 Benign neoplasm of left choroid: Secondary | ICD-10-CM | POA: Diagnosis not present

## 2024-06-03 ENCOUNTER — Other Ambulatory Visit: Payer: Self-pay | Admitting: Medical Genetics

## 2024-06-03 DIAGNOSIS — Z006 Encounter for examination for normal comparison and control in clinical research program: Secondary | ICD-10-CM

## 2024-06-16 ENCOUNTER — Other Ambulatory Visit: Payer: Self-pay | Admitting: Family Medicine

## 2024-06-16 DIAGNOSIS — F5102 Adjustment insomnia: Secondary | ICD-10-CM

## 2024-06-16 NOTE — Telephone Encounter (Signed)
 Requesting: Valium  2 MG Contract: N/A UDS: N/A Last Visit: 03/22/2024 Next Visit: N/A Last Refill: 12/30/2023  Please Advise

## 2024-06-17 DIAGNOSIS — M1712 Unilateral primary osteoarthritis, left knee: Secondary | ICD-10-CM | POA: Diagnosis not present

## 2024-06-24 DIAGNOSIS — M1712 Unilateral primary osteoarthritis, left knee: Secondary | ICD-10-CM | POA: Diagnosis not present

## 2024-06-28 LAB — GENECONNECT MOLECULAR SCREEN: Genetic Analysis Overall Interpretation: NEGATIVE

## 2024-06-29 LAB — IRON,TIBC AND FERRITIN PANEL
%SAT: 41
Ferritin: 76
Iron: 150
TIBC: 366
UIBC: 216

## 2024-06-29 LAB — CBC: RBC: 4.86 (ref 3.87–5.11)

## 2024-06-30 DIAGNOSIS — M1712 Unilateral primary osteoarthritis, left knee: Secondary | ICD-10-CM | POA: Diagnosis not present

## 2024-06-30 LAB — CBC AND DIFFERENTIAL
HCT: 48 — AB (ref 36–46)
Hemoglobin: 16 (ref 12.0–16.0)
Neutrophils Absolute: 5.2
Platelets: 311 K/uL (ref 150–400)
WBC: 7.7

## 2024-07-07 DIAGNOSIS — M1712 Unilateral primary osteoarthritis, left knee: Secondary | ICD-10-CM | POA: Diagnosis not present

## 2024-07-08 ENCOUNTER — Encounter: Payer: Self-pay | Admitting: Family Medicine

## 2024-07-12 ENCOUNTER — Encounter: Payer: Self-pay | Admitting: Family Medicine

## 2024-07-14 ENCOUNTER — Encounter: Payer: Self-pay | Admitting: Family Medicine

## 2024-08-15 ENCOUNTER — Encounter: Payer: Self-pay | Admitting: Family Medicine

## 2024-08-15 DIAGNOSIS — T753XXA Motion sickness, initial encounter: Secondary | ICD-10-CM

## 2024-08-16 MED ORDER — SCOPOLAMINE 1 MG/3DAYS TD PT72: 1.0000 | MEDICATED_PATCH | TRANSDERMAL | 0 refills | Status: AC

## 2024-08-27 ENCOUNTER — Encounter: Payer: Self-pay | Admitting: Family Medicine

## 2024-08-27 DIAGNOSIS — N951 Menopausal and female climacteric states: Secondary | ICD-10-CM

## 2024-08-27 MED ORDER — PROGESTERONE MICRONIZED 100 MG PO CAPS: 200.0000 mg | ORAL_CAPSULE | Freq: Every day | ORAL | 3 refills | Status: AC

## 2025-03-08 ENCOUNTER — Ambulatory Visit
